# Patient Record
Sex: Male | Born: 1955 | Race: White | Hispanic: No | Marital: Married | Smoking: Never smoker
Health system: Southern US, Community
[De-identification: ages and names within clinical notes are randomized; demographics above are authoritative.]

## PROBLEM LIST (undated history)

## (undated) DIAGNOSIS — E785 Hyperlipidemia, unspecified: Secondary | ICD-10-CM

## (undated) DIAGNOSIS — E119 Type 2 diabetes mellitus without complications: Secondary | ICD-10-CM

## (undated) DIAGNOSIS — I2510 Atherosclerotic heart disease of native coronary artery without angina pectoris: Secondary | ICD-10-CM

## (undated) DIAGNOSIS — I10 Essential (primary) hypertension: Secondary | ICD-10-CM

## (undated) HISTORY — DX: Hyperlipidemia, unspecified: E78.5

## (undated) HISTORY — DX: Essential (primary) hypertension: I10

## (undated) HISTORY — DX: Type 2 diabetes mellitus without complications: E11.9

## (undated) HISTORY — DX: Atherosclerotic heart disease of native coronary artery without angina pectoris: I25.10

## (undated) HISTORY — PX: CORONARY ARTERY BYPASS GRAFT: SHX141

---

## 2015-01-24 DIAGNOSIS — I779 Disorder of arteries and arterioles, unspecified: Secondary | ICD-10-CM | POA: Insufficient documentation

## 2015-01-24 DIAGNOSIS — E782 Mixed hyperlipidemia: Secondary | ICD-10-CM | POA: Insufficient documentation

## 2015-01-24 DIAGNOSIS — I10 Essential (primary) hypertension: Secondary | ICD-10-CM | POA: Insufficient documentation

## 2015-02-08 DIAGNOSIS — E119 Type 2 diabetes mellitus without complications: Secondary | ICD-10-CM | POA: Insufficient documentation

## 2015-02-10 HISTORY — PX: AORTIC VALVE REPLACEMENT: SHX41

## 2015-03-01 DIAGNOSIS — Z951 Presence of aortocoronary bypass graft: Secondary | ICD-10-CM | POA: Insufficient documentation

## 2015-03-01 DIAGNOSIS — Z952 Presence of prosthetic heart valve: Secondary | ICD-10-CM | POA: Insufficient documentation

## 2015-03-01 DIAGNOSIS — I480 Paroxysmal atrial fibrillation: Secondary | ICD-10-CM | POA: Insufficient documentation

## 2015-03-09 ENCOUNTER — Encounter: Payer: BLUE CROSS/BLUE SHIELD | Attending: Thoracic Surgery (Cardiothoracic Vascular Surgery)

## 2015-03-09 DIAGNOSIS — Z951 Presence of aortocoronary bypass graft: Secondary | ICD-10-CM | POA: Insufficient documentation

## 2015-03-09 DIAGNOSIS — Z952 Presence of prosthetic heart valve: Secondary | ICD-10-CM | POA: Insufficient documentation

## 2015-03-16 ENCOUNTER — Encounter: Payer: Self-pay | Admitting: *Deleted

## 2015-03-16 ENCOUNTER — Encounter: Payer: BLUE CROSS/BLUE SHIELD | Admitting: *Deleted

## 2015-03-16 VITALS — Ht 68.5 in | Wt 217.9 lb

## 2015-03-16 DIAGNOSIS — Z952 Presence of prosthetic heart valve: Secondary | ICD-10-CM

## 2015-03-16 DIAGNOSIS — Z951 Presence of aortocoronary bypass graft: Secondary | ICD-10-CM

## 2015-03-16 NOTE — Patient Instructions (Signed)
Patient Instructions  Patient Details  Name: Franklin Calderon MRN: [id number] Date of Birth: 07/30/1956 Referring Provider:  Lolita Lenz, MD  Below are the personal goals you chose as well as exercise and nutrition goals. Our goal is to help you keep on track towards obtaining and maintaining your goals. We will be discussing your progress on these goals with you throughout the program.  Initial Exercise Prescription:     Initial Exercise Prescription - 03/16/15 1500    Date of Initial Exercise Prescription   Date 03/16/15   Treadmill   MPH 2.5   Grade 0   Minutes 10   Recumbant Bike   Level 2   RPM 45   Watts 25   Minutes 10   NuStep   Level 2   Watts 50   Minutes 10   Arm Ergometer   Level 1   Watts 10   Minutes 10   Elliptical   Level 1   Speed 3   Minutes 1   REL-XR   Level 2   Watts 50   Minutes 10   Prescription Details   Frequency (times per week) 3   Duration Progress to 30 minutes of continuous aerobic without signs/symptoms of physical distress   Intensity   THRR REST +  20   Ratings of Perceived Exertion 11-15   Perceived Dyspnea 2-4   Progression Continue progressive overload as per policy without signs/symptoms or physical distress.   Resistance Training   Training Prescription Yes   Weight 2   Reps 10-12      Exercise Goals: Frequency: Be able to perform aerobic exercise three times per week working toward 3-5 days per week.  Intensity: Work with a perceived exertion of 11 (fairly light) - 15 (hard) as tolerated. Follow your new exercise prescription and watch for changes in prescription as you progress with the program. Changes will be reviewed with you when they are made.  Duration: You should be able to do 30 minutes of continuous aerobic exercise in addition to a 5 minute warm-up and a 5 minute cool-down routine.  Nutrition Goals: Your personal nutrition goals will be established when you do your nutrition analysis with the  dietician.  The following are nutrition guidelines to follow: Cholesterol < /day Sodium < /day Fiber: Men over 50 yrs - 30 grams per day  Personal Goals:     Personal Goals and Risk Factors at Admission - 03/16/15 1525    Personal Goals and Risk Factors on Admission   Increase Aerobic Exercise and Physical Activity Yes   Intervention While in program, learn and follow the exercise prescription taught. Start at a low level workload and increase workload after able to maintain previous level for 30 minutes. Increase time before increasing intensity.   Diabetes Yes   Goal Blood glucose control identified by blood glucose values, HgbA1C. Participant verbalizes understanding of the signs/symptoms of hyper/hypo glycemia, proper foot care and importance of medication and nutrition plan for blood glucose control.   Intervention Provide nutrition & aerobic exercise along with prescribed medications to achieve blood glucose in normal ranges: Fasting 65-99 mg/dL   Hypertension Yes   Goal Participant will see blood pressure controlled within the values of 140/19mm/Hg or within value directed by their physician.   Intervention Provide nutrition & aerobic exercise along with prescribed medications to achieve BP 140/90 or less.   Lipids Yes   Goal Cholesterol controlled with medications as prescribed, with individualized exercise RX and with  personalized nutrition plan. Value goals: LDL < 70mg , HDL > 40mg . Participant states understanding of desired cholesterol values and following prescriptions.   Intervention Provide nutrition & aerobic exercise along with prescribed medications to achieve LDL 70mg , HDL >40mg .      Tobacco Use Initial Evaluation: History  Smoking status  . Never Smoker   Smokeless tobacco  . Not on file    Copy of goals given to participant.

## 2015-03-16 NOTE — Progress Notes (Signed)
Cardiac Individual Treatment Plan  Patient Details  Name: Franklin Calderon MRN: [id number] Date of Birth: 08/11/1956 Referring Provider:  Lolita LenzKincaid, Edward Hal, MD  Initial Encounter Date: Date: 03/16/15  Visit Diagnosis: No diagnosis found.  Patient's Home Medications on Admission:  Current outpatient prescriptions:  .  amiodarone (PACERONE) 200 MG tablet, Take 400 mg by mouth., Disp: , Rfl:  .  metoprolol succinate (TOPROL-XL) 25 MG 24 hr tablet, Take 25 mg by mouth., Disp: , Rfl:  .  aspirin 325 MG tablet, Take 81 mg by mouth., Disp: , Rfl:  .  atorvastatin (LIPITOR) 20 MG tablet, Take 20 mg by mouth., Disp: , Rfl:  .  Co-Enzyme Q-10 30 MG CAPS, Take 100 mg by mouth., Disp: , Rfl:  .  Insulin Detemir (LEVEMIR FLEXPEN [place]), Inject into the skin., Disp: , Rfl:  .  losartan-hydrochlorothiazide (HYZAAR) 50-12.5 MG per tablet, Take by mouth., Disp: , Rfl:  .  metFORMIN (GLUCOPHAGE) 1000 MG tablet, Take 1,000 mg by mouth., Disp: , Rfl:  .  Multiple Vitamin (MULTI-VITAMINS) TABS, Take by mouth., Disp: , Rfl:  .  omega-3 acid ethyl esters (LOVAZA) 1 G capsule, Take by mouth., Disp: , Rfl:  .  pioglitazone (ACTOS) 30 MG tablet, Take 30 mg by mouth., Disp: , Rfl:  .  Vitamin D, Ergocalciferol, (DRISDOL) 50000 UNITS CAPS capsule, Take by mouth., Disp: , Rfl:   Past Medical History: Past Medical History  Diagnosis Date  . Coronary artery disease   . Hypertension   . Hyperlipidemia   . Diabetes mellitus without complication     Tobacco Use: History  Smoking status  . Never Smoker   Smokeless tobacco  . Not on file    Labs: Recent Review Flowsheet Data    There is no flowsheet data to display.       Exercise Target Goals: Date: 03/16/15  Exercise Program Goal: Individual exercise prescription set with THRR, safety & activity barriers. Participant demonstrates ability to understand and report RPE using BORG scale, to self-measure pulse accurately, and to acknowledge the  importance of the exercise prescription.  Exercise Prescription Goal: Starting with aerobic activity 30 plus minutes a day, 3 days per week for initial exercise prescription. Provide home exercise prescription and guidelines that participant acknowledges understanding prior to discharge.  Activity Barriers & Risk Stratification:     Activity Barriers & Risk Stratification - 03/16/15 1510    Activity Barriers & Risk Stratification   Activity Barriers None   Risk Stratification High      6 Minute Walk:     6 Minute Walk      03/16/15 1513       6 Minute Walk   Phase Initial     Distance 1515 feet     Walk Time 6 minutes     Resting HR 85 bpm     Resting BP 124/80 mmHg     Max Ex. HR 102 bpm     Max Ex. BP 124/64 mmHg     RPE 11     Perceived Dyspnea  1     Symptoms No        Initial Exercise Prescription:     Initial Exercise Prescription - 03/16/15 1500    Date of Initial Exercise Prescription   Date 03/16/15   Treadmill   MPH 2.5   Grade 0   Minutes 10   Recumbant Bike   Level 2   RPM 45   Watts 25   Minutes 10  NuStep   Level 2   Watts 50   Minutes 10   Arm Ergometer   Level 1   Watts 10   Minutes 10   Elliptical   Level 1   Speed 3   Minutes 1   REL-XR   Level 2   Watts 50   Minutes 10   Prescription Details   Frequency (times per week) 3   Duration Progress to 30 minutes of continuous aerobic without signs/symptoms of physical distress   Intensity   THRR REST +  20   Ratings of Perceived Exertion 11-15   Perceived Dyspnea 2-4   Progression Continue progressive overload as per policy without signs/symptoms or physical distress.   Resistance Training   Training Prescription Yes   Weight 2   Reps 10-12      Exercise Prescription Changes:   Discharge Exercise Prescription (Final Exercise Prescription Changes):   Nutrition:  Target Goals: Understanding of nutrition guidelines, daily intake of sodium 1500mg , cholesterol 200mg ,  calories 30% from fat and 7% or less from saturated fats, daily to have 5 or more servings of fruits and vegetables.  Biometrics:     Pre Biometrics - 03/16/15 1515    Pre Biometrics   Height 5' 8.5" (1.74 m)   Weight 217 lb 14.4 oz (98.839 kg)   Waist Circumference 41.5 inches   Hip Circumference 43.5 inches   Waist to Hip Ratio 0.95 %   BMI (Calculated) 32.7       Nutrition Therapy Plan and Nutrition Goals:     Nutrition Therapy & Goals - 03/16/15 1525    Intervention Plan   Intervention Using nutrition plan and personal goals to gain a healthy nutrition lifestyle. Add exercise as prescribed.      Nutrition Discharge: Rate Your Plate Scores:   Nutrition Goals Re-Evaluation:   Psychosocial: Target Goals: Acknowledge presence or absence of depression, maximize coping skills, provide positive support system. Participant is able to verbalize types and ability to use techniques and skills needed for reducing stress and depression.  Initial Review & Psychosocial Screening:     Initial Psych Review & Screening - 03/16/15 1520    Family Dynamics   Good Support System? Yes   Barriers   Psychosocial barriers to participate in program There are no identifiable barriers or psychosocial needs.   Screening Interventions   Interventions Encouraged to exercise      Quality of Life Scores:     Quality of Life - 03/16/15 1520    Quality of Life Scores   Health/Function Pre 26.4 %   Socioeconomic Pre 28.63 %   Psych/Spiritual Pre 23.57 %   Family Pre 28.8 %   GLOBAL Pre 26.69 %      PHQ-9:     Recent Review Flowsheet Data    Depression screen [facility] 2/9 03/16/2015   Decreased Interest 1   Down, Depressed, Hopeless 0   PHQ - 2 Score 1   Altered sleeping 1   Tired, decreased energy 1   Change in appetite 1   Feeling bad or failure about yourself  0   Trouble concentrating 0   Moving slowly or fidgety/restless 0   Suicidal thoughts 0   PHQ-9 Score 4   Difficult  doing work/chores Somewhat difficult      Psychosocial Evaluation and Intervention:   Psychosocial Re-Evaluation:   Vocational Rehabilitation: Provide vocational rehab assistance to qualifying candidates.   Vocational Rehab Evaluation & Intervention:     Vocational Rehab -  03/16/15 1523    Initial Vocational Rehab Evaluation & Intervention   Assessment shows need for Vocational Rehabilitation No      Education: Education Goals: Education classes will be provided on a weekly basis, covering required topics. Participant will state understanding/return demonstration of topics presented.  Learning Barriers/Preferences:     Learning Barriers/Preferences - 03/16/15 1512    Learning Barriers/Preferences   Learning Barriers None   Learning Preferences Written Material      Education Topics: General Nutrition Guidelines/Fats and Fiber: -Group instruction provided by verbal, written material, models and posters to present the general guidelines for heart healthy nutrition. Gives an explanation and review of dietary fats and fiber.   Controlling Sodium/Reading Food Labels: -Group verbal and written material supporting the discussion of sodium use in heart healthy nutrition. Review and explanation with models, verbal and written materials for utilization of the food label.   Exercise Physiology & Risk Factors: - Group verbal and written instruction with models to review the exercise physiology of the cardiovascular system and associated critical values. Details cardiovascular disease risk factors and the goals associated with each risk factor.   Aerobic Exercise & Resistance Training: - Gives group verbal and written discussion on the health impact of inactivity. On the components of aerobic and resistive training programs and the benefits of this training and how to safely progress through these programs.   Flexibility, Balance, General Exercise Guidelines: - Provides group  verbal and written instruction on the benefits of flexibility and balance training programs. Provides general exercise guidelines with specific guidelines to those with heart or lung disease. Demonstration and skill practice provided.   Stress Management: - Provides group verbal and written instruction about the health risks of elevated stress, cause of high stress, and healthy ways to reduce stress.   Depression: - Provides group verbal and written instruction on the correlation between heart/lung disease and depressed mood, treatment options, and the stigmas associated with seeking treatment.   Anatomy & Physiology of the Heart: - Group verbal and written instruction and models provide basic cardiac anatomy and physiology, with the coronary electrical and arterial systems. Review of: AMI, Angina, Valve disease, Heart Failure, Cardiac Arrhythmia, Pacemakers, and the ICD.   Cardiac Procedures: - Group verbal and written instruction and models to describe the testing methods done to diagnose heart disease. Reviews the outcomes of the test results. Describes the treatment choices: Medical Management, Angioplasty, or Coronary Bypass Surgery.   Cardiac Medications: - Group verbal and written instruction to review commonly prescribed medications for heart disease. Reviews the medication, class of the drug, and side effects. Includes the steps to properly store meds and maintain the prescription regimen.   Go Sex-Intimacy & Heart Disease, Get SMART - Goal Setting: - Group verbal and written instruction through game format to discuss heart disease and the return to sexual intimacy. Provides group verbal and written material to discuss and apply goal setting through the application of the S.M.A.R.T. Method.   Other Matters of the Heart: - Provides group verbal, written materials and models to describe Heart Failure, Angina, Valve Disease, and Diabetes in the realm of heart disease. Includes  description of the disease process and treatment options available to the cardiac patient.   Exercise & Equipment Safety: - Individual verbal instruction and demonstration of equipment use and safety with use of the equipment.          Most Recent Value   Date  03/16/15   Educator  C. Pearla Mckinny,RN  Instruction Review Code  1- partially meets, needs review/practice      Infection Prevention: - Provides verbal and written material to individual with discussion of infection control including proper hand washing and proper equipment cleaning during exercise session.      Most Recent Value   Date  03/16/15   Educator  C. Christiano Blandon,RN   Instruction Review Code  2- meets goals/outcomes      Falls Prevention: - Provides verbal and written material to individual with discussion of falls prevention and safety.      Most Recent Value   Date  03/16/15   Educator  C. Kitiara Hintze,RN   Instruction Review Code  2- meets goals/outcomes      Diabetes: - Individual verbal and written instruction to review signs/symptoms of diabetes, desired ranges of glucose level fasting, after meals and with exercise. Advice that pre and post exercise glucose checks will be done for 3 sessions at entry of program.      Most Recent Value   Date  03/16/15   Educator  C. Brody Kump,RN   Instruction Review Code  1- partially meets, needs review/practice       Knowledge Questionnaire Score:     Knowledge Questionnaire Score - 03/16/15 1521    Knowledge Questionnaire Score   Pre Score 26      Personal Goals and Risk Factors at Admission:     Personal Goals and Risk Factors at Admission - 03/16/15 1525    Personal Goals and Risk Factors on Admission   Increase Aerobic Exercise and Physical Activity Yes   Intervention While in program, learn and follow the exercise prescription taught. Start at a low level workload and increase workload after able to maintain previous level for 30 minutes. Increase time  before increasing intensity.   Diabetes Yes   Goal Blood glucose control identified by blood glucose values, HgbA1C. Participant verbalizes understanding of the signs/symptoms of hyper/hypo glycemia, proper foot care and importance of medication and nutrition plan for blood glucose control.   Intervention Provide nutrition & aerobic exercise along with prescribed medications to achieve blood glucose in normal ranges: Fasting 65-99 mg/dL   Hypertension Yes   Goal Participant will see blood pressure controlled within the values of 140/3mm/Hg or within value directed by their physician.   Intervention Provide nutrition & aerobic exercise along with prescribed medications to achieve BP 140/90 or less.   Lipids Yes   Goal Cholesterol controlled with medications as prescribed, with individualized exercise RX and with personalized nutrition plan. Value goals: LDL < , HDL > . Participant states understanding of desired cholesterol values and following prescriptions.   Intervention Provide nutrition & aerobic exercise along with prescribed medications to achieve LDL 70mg , HDL >40mg .      Personal Goals and Risk Factors Review:    Personal Goals Discharge:     Comments: Medical Review done. Ready to start Cardiac Rehab on Monday.

## 2015-03-20 DIAGNOSIS — Z951 Presence of aortocoronary bypass graft: Secondary | ICD-10-CM

## 2015-03-20 DIAGNOSIS — Z952 Presence of prosthetic heart valve: Secondary | ICD-10-CM

## 2015-03-20 LAB — GLUCOSE, CAPILLARY: Glucose-Capillary: 172 mg/dL — ABNORMAL HIGH (ref 65–99)

## 2015-03-20 NOTE — Progress Notes (Signed)
Daily Session Note  Patient Details  Name: Franklin Calderon MRN: [id number] Date of Birth: 03-Dec-1955 Referring Provider:  Almyra Deforest, MD  Encounter Date: 03/20/2015  Check In:     Session Check In - 03/20/15 1628    Check-In   Staff Present Lestine Box BS, ACSM EP-C, Exercise Physiologist;Mary Kellie Shropshire RN;Susanne Bice RN, BSN, [place]   ER physicians immediately available to respond to emergencies See telemetry face sheet for immediately available ER MD   Medication changes reported     No   Fall or balance concerns reported    No   Warm-up and Cool-down Performed on first and last piece of equipment   VAD Patient? No   Pain Assessment   Currently in Pain? No/denies         Goals Met:  Exercise tolerated well No report of cardiac concerns or symptoms  Goals Unmet:  Not Applicable  Goals Comments: Today was [name]'s first day with exercise and he did well with his prescription.     Dr. Emily Filbert is Medical Director for [facility].

## 2015-03-21 ENCOUNTER — Encounter: Payer: Self-pay | Admitting: *Deleted

## 2015-03-21 DIAGNOSIS — Z952 Presence of prosthetic heart valve: Secondary | ICD-10-CM

## 2015-03-21 DIAGNOSIS — Z951 Presence of aortocoronary bypass graft: Secondary | ICD-10-CM

## 2015-03-21 NOTE — Progress Notes (Signed)
Cardiac Individual Treatment Plan  Patient Details  Name: Delmos Velaquez MRN: [id number] Date of Birth: 04-16-1956 Referring Provider:  Lolita Lenz, MD  Initial Encounter Date:  03/16/2015  Visit Diagnosis: S/P CABG (coronary artery bypass graft)  S/P aortic valve replacement  Patient's Home Medications on Admission:  Current outpatient prescriptions:  .  amiodarone (PACERONE) 200 MG tablet, Take 400 mg by mouth., Disp: , Rfl:  .  aspirin 325 MG tablet, Take 81 mg by mouth., Disp: , Rfl:  .  atorvastatin (LIPITOR) 20 MG tablet, Take 20 mg by mouth., Disp: , Rfl:  .  Co-Enzyme Q-10 30 MG CAPS, Take 100 mg by mouth., Disp: , Rfl:  .  Insulin Detemir (LEVEMIR FLEXPEN Baker), Inject into the skin., Disp: , Rfl:  .  losartan-hydrochlorothiazide (HYZAAR) 50-12.5 MG per tablet, Take by mouth., Disp: , Rfl:  .  metFORMIN (GLUCOPHAGE) 1000 MG tablet, Take 1,000 mg by mouth., Disp: , Rfl:  .  metoprolol succinate (TOPROL-XL) 25 MG 24 hr tablet, Take 25 mg by mouth., Disp: , Rfl:  .  Multiple Vitamin (MULTI-VITAMINS) TABS, Take by mouth., Disp: , Rfl:  .  omega-3 acid ethyl esters (LOVAZA) 1 G capsule, Take by mouth., Disp: , Rfl:  .  pioglitazone (ACTOS) 30 MG tablet, Take 30 mg by mouth., Disp: , Rfl:  .  Vitamin D, Ergocalciferol, (DRISDOL) 50000 UNITS CAPS capsule, Take by mouth., Disp: , Rfl:   Past Medical History: Past Medical History  Diagnosis Date  . Coronary artery disease   . Hypertension   . Hyperlipidemia   . Diabetes mellitus without complication     Tobacco Use: History  Smoking status  . Never Smoker   Smokeless tobacco  . Not on file    Labs: Recent Review Flowsheet Data    There is no flowsheet data to display.       Exercise Target Goals:    Exercise Program Goal: Individual exercise prescription set with THRR, safety & activity barriers. Participant demonstrates ability to understand and report RPE using BORG scale, to self-measure pulse  accurately, and to acknowledge the importance of the exercise prescription.  Exercise Prescription Goal: Starting with aerobic activity 30 plus minutes a day, 3 days per week for initial exercise prescription. Provide home exercise prescription and guidelines that participant acknowledges understanding prior to discharge.  Activity Barriers & Risk Stratification:     Activity Barriers & Risk Stratification - 03/16/15 1510    Activity Barriers & Risk Stratification   Activity Barriers None   Risk Stratification High      6 Minute Walk:     6 Minute Walk      03/16/15 1513       6 Minute Walk   Phase Initial     Distance 1515 feet     Walk Time 6 minutes     Resting HR 85 bpm     Resting BP 124/80 mmHg     Max Ex. HR 102 bpm     Max Ex. BP 124/64 mmHg     RPE 11     Perceived Dyspnea  1     Symptoms No        Initial Exercise Prescription:     Initial Exercise Prescription - 03/16/15 1500    Date of Initial Exercise Prescription   Date 03/16/15   Treadmill   MPH 2.5   Grade 0   Minutes 10   Recumbant Bike   Level 2   RPM 45  Watts 25   Minutes 10   NuStep   Level 2   Watts 50   Minutes 10   Arm Ergometer   Level 1   Watts 10   Minutes 10   Elliptical   Level 1   Speed 3   Minutes 1   REL-XR   Level 2   Watts 50   Minutes 10   Prescription Details   Frequency (times per week) 3   Duration Progress to 30 minutes of continuous aerobic without signs/symptoms of physical distress   Intensity   THRR REST +  20   Ratings of Perceived Exertion 11-15   Perceived Dyspnea 2-4   Progression Continue progressive overload as per policy without signs/symptoms or physical distress.   Resistance Training   Training Prescription Yes   Weight 2   Reps 10-12      Exercise Prescription Changes:   Discharge Exercise Prescription (Final Exercise Prescription Changes):   Nutrition:  Target Goals: Understanding of nutrition guidelines, daily intake of  sodium 1500mg , cholesterol 200mg , calories 30% from fat and 7% or less from saturated fats, daily to have 5 or more servings of fruits and vegetables.  Biometrics:     Pre Biometrics - 03/16/15 1515    Pre Biometrics   Height 5' 8.5" (1.74 m)   Weight 217 lb 14.4 oz (98.839 kg)   Waist Circumference 41.5 inches   Hip Circumference 43.5 inches   Waist to Hip Ratio 0.95 %   BMI (Calculated) 32.7       Nutrition Therapy Plan and Nutrition Goals:     Nutrition Therapy & Goals - 03/16/15 1525    Intervention Plan   Intervention Using nutrition plan and personal goals to gain a healthy nutrition lifestyle. Add exercise as prescribed.      Nutrition Discharge: Rate Your Plate Scores:   Nutrition Goals Re-Evaluation:   Psychosocial: Target Goals: Acknowledge presence or absence of depression, maximize coping skills, provide positive support system. Participant is able to verbalize types and ability to use techniques and skills needed for reducing stress and depression.  Initial Review & Psychosocial Screening:     Initial Psych Review & Screening - 03/16/15 1520    Family Dynamics   Good Support System? Yes   Barriers   Psychosocial barriers to participate in program There are no identifiable barriers or psychosocial needs.   Screening Interventions   Interventions Encouraged to exercise      Quality of Life Scores:     Quality of Life - 03/16/15 1520    Quality of Life Scores   Health/Function Pre 26.4 %   Socioeconomic Pre 28.63 %   Psych/Spiritual Pre 23.57 %   Family Pre 28.8 %   GLOBAL Pre 26.69 %      PHQ-9:     Recent Review Flowsheet Data    Depression screen [facility] 2/9 03/16/2015   Decreased Interest 1   Down, Depressed, Hopeless 0   PHQ - 2 Score 1   Altered sleeping 1   Tired, decreased energy 1   Change in appetite 1   Feeling bad or failure about yourself  0   Trouble concentrating 0   Moving slowly or fidgety/restless 0   Suicidal  thoughts 0   PHQ-9 Score 4   Difficult doing work/chores Somewhat difficult      Psychosocial Evaluation and Intervention:   Psychosocial Re-Evaluation:   Vocational Rehabilitation: Provide vocational rehab assistance to qualifying candidates.   Vocational Rehab Evaluation &  Intervention:     Vocational Rehab - 03/16/15 1523    Initial Vocational Rehab Evaluation & Intervention   Assessment shows need for Vocational Rehabilitation No      Education: Education Goals: Education classes will be provided on a weekly basis, covering required topics. Participant will state understanding/return demonstration of topics presented.  Learning Barriers/Preferences:     Learning Barriers/Preferences - 03/16/15 1512    Learning Barriers/Preferences   Learning Barriers None   Learning Preferences Written Material      Education Topics: General Nutrition Guidelines/Fats and Fiber: -Group instruction provided by verbal, written material, models and posters to present the general guidelines for heart healthy nutrition. Gives an explanation and review of dietary fats and fiber.   Controlling Sodium/Reading Food Labels: -Group verbal and written material supporting the discussion of sodium use in heart healthy nutrition. Review and explanation with models, verbal and written materials for utilization of the food label.   Exercise Physiology & Risk Factors: - Group verbal and written instruction with models to review the exercise physiology of the cardiovascular system and associated critical values. Details cardiovascular disease risk factors and the goals associated with each risk factor.   Aerobic Exercise & Resistance Training: - Gives group verbal and written discussion on the health impact of inactivity. On the components of aerobic and resistive training programs and the benefits of this training and how to safely progress through these programs.          Cardiac Rehab from  03/20/2015 in [facility] Cardiac Rehab   Date  03/20/15   Educator  SW   Instruction Review Code  2- meets goals/outcomes      Flexibility, Balance, General Exercise Guidelines: - Provides group verbal and written instruction on the benefits of flexibility and balance training programs. Provides general exercise guidelines with specific guidelines to those with heart or lung disease. Demonstration and skill practice provided.   Stress Management: - Provides group verbal and written instruction about the health risks of elevated stress, cause of high stress, and healthy ways to reduce stress.   Depression: - Provides group verbal and written instruction on the correlation between heart/lung disease and depressed mood, treatment options, and the stigmas associated with seeking treatment.   Anatomy & Physiology of the Heart: - Group verbal and written instruction and models provide basic cardiac anatomy and physiology, with the coronary electrical and arterial systems. Review of: AMI, Angina, Valve disease, Heart Failure, Cardiac Arrhythmia, Pacemakers, and the ICD.   Cardiac Procedures: - Group verbal and written instruction and models to describe the testing methods done to diagnose heart disease. Reviews the outcomes of the test results. Describes the treatment choices: Medical Management, Angioplasty, or Coronary Bypass Surgery.   Cardiac Medications: - Group verbal and written instruction to review commonly prescribed medications for heart disease. Reviews the medication, class of the drug, and side effects. Includes the steps to properly store meds and maintain the prescription regimen.   Go Sex-Intimacy & Heart Disease, Get SMART - Goal Setting: - Group verbal and written instruction through game format to discuss heart disease and the return to sexual intimacy. Provides group verbal and written material to discuss and apply goal setting through the application of the S.M.A.R.T.  Method.   Other Matters of the Heart: - Provides group verbal, written materials and models to describe Heart Failure, Angina, Valve Disease, and Diabetes in the realm of heart disease. Includes description of the disease process and treatment options available to the cardiac  patient.   Exercise & Equipment Safety: - Individual verbal instruction and demonstration of equipment use and safety with use of the equipment.      [facility]   Date  03/16/15   Educator  C. Enterkin,RN   Instruction Review Code  1- partially meets, needs review/practice      Infection Prevention: - Provides verbal and written material to individual with discussion of infection control including proper hand washing and proper equipment cleaning during exercise session.      [facility]   Date  03/16/15   Educator  C. Enterkin,RN   Instruction Review Code  2- meets goals/outcomes      Falls Prevention: - Provides verbal and written material to individual with discussion of falls prevention and safety.      [facility]   Date  03/16/15   Educator  C. Enterkin,RN   Instruction Review Code  2- meets goals/outcomes      Diabetes: - Individual verbal and written instruction to review signs/symptoms of diabetes, desired ranges of glucose level fasting, after meals and with exercise. Advice that pre and post exercise glucose checks will be done for 3 sessions at entry of program.      [facility]   Date  03/16/15   Educator  C. Enterkin,RN   Instruction Review Code  1- partially meets, needs review/practice       Knowledge Questionnaire Score:     Knowledge Questionnaire Score - 03/16/15 1521    Knowledge Questionnaire Score   Pre Score 26      Personal Goals and Risk Factors at Admission:     Personal Goals and Risk Factors at Admission -  03/16/15 1525    Personal Goals and Risk Factors on Admission   Increase Aerobic Exercise and Physical Activity Yes   Intervention While in program, learn and follow the exercise prescription taught. Start at a low level workload and increase workload after able to maintain previous level for 30 minutes. Increase time before increasing intensity.   Diabetes Yes   Goal Blood glucose control identified by blood glucose values, HgbA1C. Participant verbalizes understanding of the signs/symptoms of hyper/hypo glycemia, proper foot care and importance of medication and nutrition plan for blood glucose control.   Intervention Provide nutrition & aerobic exercise along with prescribed medications to achieve blood glucose in normal ranges: Fasting 65-99 mg/dL   Hypertension Yes   Goal Participant will see blood pressure controlled within the values of 140/3790mm/Hg or within value directed by their physician.   Intervention Provide nutrition & aerobic exercise along with prescribed medications to achieve BP 140/90 or less.   Lipids Yes   Goal Cholesterol controlled with medications as prescribed, with individualized exercise RX and with personalized nutrition plan. Value goals: LDL < 70mg , HDL > 40mg . Participant states understanding of desired cholesterol values and following prescriptions.   Intervention Provide nutrition & aerobic exercise along with prescribed medications to achieve LDL 70mg , HDL >40mg .      Personal Goals and Risk Factors Review:    Personal Goals Discharge:     Comments: 30 day review.  Molly MaduroRobert has completed orientation and will start classes soon.

## 2015-03-22 ENCOUNTER — Other Ambulatory Visit: Payer: Self-pay | Admitting: *Deleted

## 2015-03-22 ENCOUNTER — Encounter: Payer: BLUE CROSS/BLUE SHIELD | Admitting: *Deleted

## 2015-03-22 DIAGNOSIS — Z952 Presence of prosthetic heart valve: Secondary | ICD-10-CM

## 2015-03-22 DIAGNOSIS — Z951 Presence of aortocoronary bypass graft: Secondary | ICD-10-CM | POA: Diagnosis not present

## 2015-03-22 LAB — GLUCOSE, CAPILLARY
Glucose-Capillary: 148 mg/dL — ABNORMAL HIGH (ref 65–99)
Glucose-Capillary: 115 mg/dL — ABNORMAL HIGH (ref 65–99)

## 2015-03-22 NOTE — Progress Notes (Signed)
Daily Session Note  Patient Details  Name: Helmuth Recupero MRN: [id number] Date of Birth: February 20, 1956 Referring Provider:  Almyra Deforest, MD  Encounter Date: 03/22/2015  Check In:     Session Check In - 03/22/15 1606    Check-In   Staff Present Nyoka Cowden RN;Renee [place] MS, ACSM CEP Exercise Physiologist;Shahil Speegle Mariana Arn, BSN   ER physicians immediately available to respond to emergencies See telemetry face sheet for immediately available ER MD   Medication changes reported     No   Fall or balance concerns reported    No   Warm-up and Cool-down Performed on first and last piece of equipment   VAD Patient? No   Pain Assessment   Currently in Pain? No/denies         Goals Met:  Exercise tolerated well No report of cardiac concerns or symptoms Strength training completed today  Goals Unmet:  Not Applicable  Goals Comments:    Dr. Emily Filbert is Medical Director for [place] and [facility].

## 2015-03-23 DIAGNOSIS — Z951 Presence of aortocoronary bypass graft: Secondary | ICD-10-CM | POA: Diagnosis not present

## 2015-03-23 DIAGNOSIS — Z952 Presence of prosthetic heart valve: Secondary | ICD-10-CM

## 2015-03-23 LAB — GLUCOSE, CAPILLARY
GLUCOSE-CAPILLARY: 164 mg/dL — AB (ref 65–99)
GLUCOSE-CAPILLARY: 100 mg/dL — AB (ref 65–99)

## 2015-03-23 NOTE — Progress Notes (Signed)
Daily Session Note  Patient Details  Name: Franklin Calderon MRN: [id number] Date of Birth: 09-09-1955 Referring Provider:  Almyra Deforest, MD  Encounter Date: 03/23/2015  Check In:     Session Check In - 03/23/15 1605    Check-In   Staff Present Gerlene Burdock RN, BSN;Kameshia Madruga BS, ACSM EP-C, Exercise Physiologist;Diane Mariana Arn, BSN   ER physicians immediately available to respond to emergencies See telemetry face sheet for immediately available ER MD   Medication changes reported     No   Fall or balance concerns reported    No   Warm-up and Cool-down Performed on first and last piece of equipment   VAD Patient? No   Pain Assessment   Currently in Pain? No/denies         Goals Met:  Proper associated with RPD/PD & O2 Sat Exercise tolerated well No report of cardiac concerns or symptoms Strength training completed today  Goals Unmet:  Not Applicable  Goals Comments:   Dr. Emily Filbert is Medical Director for [place] and [facility].

## 2015-03-27 ENCOUNTER — Encounter: Payer: BLUE CROSS/BLUE SHIELD | Admitting: *Deleted

## 2015-03-27 DIAGNOSIS — Z951 Presence of aortocoronary bypass graft: Secondary | ICD-10-CM | POA: Diagnosis not present

## 2015-03-27 DIAGNOSIS — Z952 Presence of prosthetic heart valve: Secondary | ICD-10-CM

## 2015-03-27 LAB — GLUCOSE, CAPILLARY: Glucose-Capillary: 115 mg/dL — ABNORMAL HIGH (ref 65–99)

## 2015-03-27 NOTE — Progress Notes (Signed)
Daily Session Note  Patient Details  Name: Franklin Calderon MRN: [id number] Date of Birth: [birth year] Referring Provider:  Almyra Deforest, MD  Encounter Date: 03/27/2015  Check In:     Session Check In - 03/27/15 1610    Check-In   Staff Present Jackolyn Geron Joya Gaskins RN, BSN;Carroll Enterkin RN, BSN  Hessie Knows, Exercise SpecIiaist   ER physicians immediately available to respond to emergencies See telemetry face sheet for immediately available ER MD   Medication changes reported     No   Fall or balance concerns reported    No   Warm-up and Cool-down Performed on first and last piece of equipment   VAD Patient? No   Pain Assessment   Currently in Pain? No/denies         Goals Met:  Exercise tolerated well No report of cardiac concerns or symptoms Strength training completed today  Exercise goals reviewed with participant.    Goals Unmet:  Not Applicable  Goals Comments:    Dr. Emily Filbert is Medical Director for [place] and [facility].

## 2015-03-29 ENCOUNTER — Encounter: Payer: BLUE CROSS/BLUE SHIELD | Admitting: *Deleted

## 2015-03-29 DIAGNOSIS — Z951 Presence of aortocoronary bypass graft: Secondary | ICD-10-CM | POA: Diagnosis not present

## 2015-03-29 DIAGNOSIS — Z952 Presence of prosthetic heart valve: Secondary | ICD-10-CM

## 2015-03-29 NOTE — Progress Notes (Signed)
Daily Session Note  Patient Details  Name: Franklin Calderon MRN: [id number] Date of Birth: 21-Sep-1955 Referring Provider:  Almyra Deforest, MD  Encounter Date: 03/29/2015  Check In:     Session Check In - 03/29/15 1602    Check-In   Staff Present Candiss Norse MS, ACSM CEP Exercise Physiologist;Susanne Bice RN, BSN, CCRP;Carroll Enterkin RN, BSN   ER physicians immediately available to respond to emergencies See telemetry face sheet for immediately available ER MD   Medication changes reported     No   Fall or balance concerns reported    No   Warm-up and Cool-down Performed on first and last piece of equipment   VAD Patient? No   Pain Assessment   Currently in Pain? No/denies   Multiple Pain Sites No         Goals Met:  Independence with exercise equipment Exercise tolerated well Personal goals reviewed No report of cardiac concerns or symptoms Strength training completed today  Goals Unmet:  Not Applicable  Goals Comments:   Dr. Emily Filbert is Medical Director for [place] and [facility].

## 2015-03-30 DIAGNOSIS — Z952 Presence of prosthetic heart valve: Secondary | ICD-10-CM

## 2015-03-30 DIAGNOSIS — Z951 Presence of aortocoronary bypass graft: Secondary | ICD-10-CM | POA: Diagnosis not present

## 2015-03-30 NOTE — Progress Notes (Signed)
Daily Session Note  Patient Details  Name: Alisha Burgo MRN: [id number] Date of Birth: 1956-07-14 Referring Provider:  Almyra Deforest, MD  Encounter Date: 03/30/2015  Check In:     Session Check In - 03/30/15 1621    Check-In   Staff Present Lestine Box BS, ACSM EP-C, Exercise Physiologist;Carroll Enterkin RN, BSN;Diane Joya Gaskins RN, BSN   ER physicians immediately available to respond to emergencies See telemetry face sheet for immediately available ER MD   Medication changes reported     No   Fall or balance concerns reported    No   Warm-up and Cool-down Performed on first and last piece of equipment   VAD Patient? No   Pain Assessment   Currently in Pain? No/denies         Goals Met:  Proper associated with RPD/PD & O2 Sat Exercise tolerated well No report of cardiac concerns or symptoms Strength training completed today  Goals Unmet:  Not Applicable  Goals Comments:    Dr. Emily Filbert is Medical Director for [place] and [facility].

## 2015-04-03 ENCOUNTER — Encounter: Payer: BLUE CROSS/BLUE SHIELD | Attending: Thoracic Surgery (Cardiothoracic Vascular Surgery)

## 2015-04-03 DIAGNOSIS — Z952 Presence of prosthetic heart valve: Secondary | ICD-10-CM | POA: Insufficient documentation

## 2015-04-03 DIAGNOSIS — Z951 Presence of aortocoronary bypass graft: Secondary | ICD-10-CM | POA: Diagnosis not present

## 2015-04-03 NOTE — Progress Notes (Signed)
Daily Session Note  Patient Details  Name: Franklin Calderon MRN: [id number] Date of Birth: 04/06/1956 Referring Provider:  Almyra Deforest, MD  Encounter Date: 04/03/2015  Check In:     Session Check In - 04/03/15 1631    Check-In   Staff Present Heath Lark RN, BSN, CCRP;Carroll Enterkin RN, BSN;Other   ER physicians immediately available to respond to emergencies See telemetry face sheet for immediately available ER MD   Medication changes reported     No   Fall or balance concerns reported    No   Warm-up and Cool-down Performed on first and last piece of equipment   VAD Patient? No   Pain Assessment   Currently in Pain? No/denies         Goals Met:  Independence with exercise equipment Exercise tolerated well No report of cardiac concerns or symptoms Strength training completed today  Goals Unmet:  Not Applicable  Goals Comments:    Dr. Emily Filbert is Medical Director for [place] and [facility].

## 2015-04-05 ENCOUNTER — Encounter: Payer: BLUE CROSS/BLUE SHIELD | Admitting: *Deleted

## 2015-04-05 DIAGNOSIS — Z951 Presence of aortocoronary bypass graft: Secondary | ICD-10-CM

## 2015-04-05 NOTE — Progress Notes (Signed)
Daily Session Note  Patient Details  Name: Geovany Trudo MRN: [id number] Date of Birth: 02-Feb-1956 Referring Provider:  Almyra Deforest, MD  Encounter Date: 04/05/2015  Check In:     Session Check In - 04/05/15 1741    Check-In   Staff Present Gerlene Burdock RN, BSN;Diane Joya Gaskins RN, BSN;Renee Dillard [place] MS, ACSM CEP Exercise Physiologist   ER physicians immediately available to respond to emergencies See telemetry face sheet for immediately available ER MD   Medication changes reported     Yes   Fall or balance concerns reported    Yes   Warm-up and Cool-down Performed on first and last piece of equipment   VAD Patient? No   Pain Assessment   Currently in Pain? No/denies         Goals Met:  Proper associated with RPD/PD & O2 Sat Exercise tolerated well  Goals Unmet:  Not Applicable  Goals Comments: Doing well in Cardiac Rehab.    Dr. Emily Filbert is Medical Director for [place] and [facility].

## 2015-04-06 DIAGNOSIS — Z951 Presence of aortocoronary bypass graft: Secondary | ICD-10-CM | POA: Diagnosis not present

## 2015-04-06 NOTE — Progress Notes (Signed)
Discharge Summary  Patient Details  Name: Franklin Calderon MRN: [id number] Date of Birth: [birth year] Referring Provider:  Lolita Lenz, MD   Number of Visits: 14/36  Reason for Discharge:  Early Exit:  Back to work.  Patient leaving the program at this time, as he is returning to work on Monday, April 10, 2015.   Smoking History:  History  Smoking status  . Never Smoker   Smokeless tobacco  . Not on file    Diagnosis:  S/P CABG (coronary artery bypass graft)  ADL UCSD:   Initial Exercise Prescription:     Initial Exercise Prescription - 03/16/15 1500    Date of Initial Exercise Prescription   Date 03/16/15   Treadmill   MPH 2.5   Grade 0   Minutes 10   Recumbant Bike   Level 2   RPM 45   Watts 25   Minutes 10   NuStep   Level 2   Watts 50   Minutes 10   Arm Ergometer   Level 1   Watts 10   Minutes 10   Elliptical   Level 1   Speed 3   Minutes 1   REL-XR   Level 2   Watts 50   Minutes 10   Prescription Details   Frequency (times per week) 3   Duration Progress to 30 minutes of continuous aerobic without signs/symptoms of physical distress   Intensity   THRR REST +  20   Ratings of Perceived Exertion 11-15   Perceived Dyspnea 2-4   Progression Continue progressive overload as per policy without signs/symptoms or physical distress.   Resistance Training   Training Prescription Yes   Weight 2   Reps 10-12      Discharge Exercise Prescription (Final Exercise Prescription Changes):     Exercise Prescription Changes - 03/27/15 1600    Exercise Review   Progression Yes   Resistance Training   Training Prescription Yes   Weight 3 lbs   Reps 10-12   NuStep   Level 5   Watts 60   Minutes 20   Elliptical   Level 2   Speed 3.5   Minutes 20      Functional Capacity:     6 Minute Walk      03/16/15 1513       6 Minute Walk   Phase Initial     Distance 1515 feet     Walk Time 6 minutes     Resting HR 85 bpm     Resting BP  124/80 mmHg     Max Ex. HR 102 bpm     Max Ex. BP 124/64 mmHg     RPE 11     Perceived Dyspnea  1     Symptoms No        Psychological, QOL, Others - Outcomes: PHQ 2/9: Depression screen PHQ 2/9 03/16/2015  Decreased Interest 1  Down, Depressed, Hopeless 0  PHQ - 2 Score 1  Altered sleeping 1  Tired, decreased energy 1  Change in appetite 1  Feeling bad or failure about yourself  0  Trouble concentrating 0  Moving slowly or fidgety/restless 0  Suicidal thoughts 0  PHQ-9 Score 4  Difficult doing work/chores Somewhat difficult    Quality of Life:     Quality of Life - 03/16/15 1520    Quality of Life Scores   Health/Function Pre 26.4 %   Socioeconomic Pre 28.63 %   Psych/Spiritual Pre 23.57 %  Family Pre 28.8 %   GLOBAL Pre 26.69 %      Personal Goals: Goals established at orientation with interventions provided to work toward goal.     Personal Goals and Risk Factors at Admission - 03/16/15 1525    Personal Goals and Risk Factors on Admission   Increase Aerobic Exercise and Physical Activity Yes   Intervention While in program, learn and follow the exercise prescription taught. Start at a low level workload and increase workload after able to maintain previous level for 30 minutes. Increase time before increasing intensity.   Diabetes Yes   Goal Blood glucose control identified by blood glucose values, HgbA1C. Participant verbalizes understanding of the signs/symptoms of hyper/hypo glycemia, proper foot care and importance of medication and nutrition plan for blood glucose control.   Intervention Provide nutrition & aerobic exercise along with prescribed medications to achieve blood glucose in normal ranges: Fasting 65-99 mg/dL   Hypertension Yes   Goal Participant will see blood pressure controlled within the values of 140/55mm/Hg or within value directed by their physician.   Intervention Provide nutrition & aerobic exercise along with prescribed medications to  achieve BP 140/90 or less.   Lipids Yes   Goal Cholesterol controlled with medications as prescribed, with individualized exercise RX and with personalized nutrition plan. Value goals: LDL < , HDL > . Participant states understanding of desired cholesterol values and following prescriptions.   Intervention Provide nutrition & aerobic exercise along with prescribed medications to achieve LDL 70mg , HDL >40mg .       Personal Goals Discharge:     Personal Goals at Discharge - 04/06/15 1754    Increase Aerobic Exercise and Physical Activity   Goals Progress/Improvement seen  Yes   Comments Patient discharging early, as he is returning to work.  He and his wife plan to join the [employer] to continue exercising.        Nutrition & Weight - Outcomes:     Pre Biometrics - 03/16/15 1515    Pre Biometrics   Height 5' 8.5" (1.74 m)   Weight 217 lb 14.4 oz (98.839 kg)   Waist Circumference 41.5 inches   Hip Circumference 43.5 inches   Waist to Hip Ratio 0.95 %   BMI (Calculated) 32.7       Nutrition:     Nutrition Therapy & Goals - 03/16/15 1525    Intervention Plan   Intervention Using nutrition plan and personal goals to gain a healthy nutrition lifestyle. Add exercise as prescribed.      Nutrition Discharge:   Education Questionnaire Score:     Knowledge Questionnaire Score - 03/16/15 1521    Knowledge Questionnaire Score   Pre Score 26      Goals reviewed with patient; copy given to patient.

## 2015-04-06 NOTE — Addendum Note (Signed)
Addended by: Suszanne Finch on: 04/06/2015 06:02 PM   Modules accepted: Orders

## 2015-04-06 NOTE — Progress Notes (Signed)
Daily Session Note  Patient Details  Name: Franklin Calderon MRN: [id number] Date of Birth: Dec 09, 1955 Referring Provider:  Almyra Deforest, MD  Encounter Date: 04/06/2015  Check In:     Session Check In - 04/06/15 1636    Check-In   Staff Present Lestine Box BS, ACSM EP-C, Exercise Physiologist;Carroll Enterkin RN, BSN;Diane Joya Gaskins RN, BSN   ER physicians immediately available to respond to emergencies See telemetry face sheet for immediately available ER MD   Medication changes reported     No   Fall or balance concerns reported    No   Warm-up and Cool-down Performed on first and last piece of equipment   VAD Patient? No   Pain Assessment   Currently in Pain? No/denies         Goals Met:  Proper associated with RPD/PD & O2 Sat Exercise tolerated well No report of cardiac concerns or symptoms Strength training completed today  Goals Unmet:  Not Applicable  Goals Comments:    Dr. Emily Filbert is Medical Director for [facility].

## 2015-04-14 ENCOUNTER — Encounter: Payer: Self-pay | Admitting: *Deleted

## 2015-04-14 DIAGNOSIS — Z951 Presence of aortocoronary bypass graft: Secondary | ICD-10-CM

## 2015-04-14 DIAGNOSIS — Z952 Presence of prosthetic heart valve: Secondary | ICD-10-CM

## 2015-04-14 NOTE — Progress Notes (Signed)
Cardiac Individual Treatment Plan  Patient Details  Name: Franklin Calderon MRN: [id number] Date of Birth: [birth year] Referring Provider:  Almyra Deforest, MD  Initial Encounter Date:    Visit Diagnosis: S/P CABG (coronary artery bypass graft)  S/P aortic valve replacement  Patient's Home Medications on Admission:  Current outpatient prescriptions:  .  amiodarone (PACERONE) 200 MG tablet, Take 400 mg by mouth., Disp: , Rfl:  .  aspirin 325 MG tablet, Take 81 mg by mouth., Disp: , Rfl:  .  atorvastatin (LIPITOR) 20 MG tablet, Take 20 mg by mouth., Disp: , Rfl:  .  Co-Enzyme Q-10 30 MG CAPS, Take 100 mg by mouth., Disp: , Rfl:  .  Insulin Detemir (LEVEMIR FLEXPEN [place]), Inject into the skin., Disp: , Rfl:  .  losartan-hydrochlorothiazide (HYZAAR) 50-12.5 MG per tablet, Take by mouth., Disp: , Rfl:  .  metFORMIN (GLUCOPHAGE) 1000 MG tablet, Take 1,000 mg by mouth., Disp: , Rfl:  .  metoprolol succinate (TOPROL-XL) 25 MG 24 hr tablet, Take 25 mg by mouth., Disp: , Rfl:  .  Multiple Vitamin (MULTI-VITAMINS) TABS, Take by mouth., Disp: , Rfl:  .  omega-3 acid ethyl esters (LOVAZA) 1 G capsule, Take by mouth., Disp: , Rfl:  .  pioglitazone (ACTOS) 30 MG tablet, Take 30 mg by mouth., Disp: , Rfl:  .  Vitamin D, Ergocalciferol, (DRISDOL) 50000 UNITS CAPS capsule, Take by mouth., Disp: , Rfl:   Past Medical History: Past Medical History  Diagnosis Date  . Coronary artery disease   . Hypertension   . Hyperlipidemia   . Diabetes mellitus without complication     Tobacco Use: History  Smoking status  . Never Smoker   Smokeless tobacco  . Not on file    Labs: Recent Review Flowsheet Data    There is no flowsheet data to display.       Exercise Target Goals:    Exercise Program Goal: Individual exercise prescription set with THRR, safety & activity barriers. Participant demonstrates ability to understand and report RPE using BORG scale, to self-measure pulse accurately, and to  acknowledge the importance of the exercise prescription.  Exercise Prescription Goal: Starting with aerobic activity 30 plus minutes a day, 3 days per week for initial exercise prescription. Provide home exercise prescription and guidelines that participant acknowledges understanding prior to discharge.  Activity Barriers & Risk Stratification:     Activity Barriers & Risk Stratification - 03/16/15 1510    Activity Barriers & Risk Stratification   Activity Barriers None   Risk Stratification High      6 Minute Walk:     6 Minute Walk      03/16/15 1513       6 Minute Walk   Phase Initial     Distance 1515 feet     Walk Time 6 minutes     Resting HR 85 bpm     Resting BP 124/80 mmHg     Max Ex. HR 102 bpm     Max Ex. BP 124/64 mmHg     RPE 11     Perceived Dyspnea  1     Symptoms No        Initial Exercise Prescription:     Initial Exercise Prescription - 03/16/15 1500    Date of Initial Exercise Prescription   Date 03/16/15   Treadmill   MPH 2.5   Grade 0   Minutes 10   Recumbant Bike   Level 2   RPM 45  Watts 25   Minutes 10   NuStep   Level 2   Watts 50   Minutes 10   Arm Ergometer   Level 1   Watts 10   Minutes 10   Elliptical   Level 1   Speed 3   Minutes 1   REL-XR   Level 2   Watts 50   Minutes 10   Prescription Details   Frequency (times per week) 3   Duration Progress to 30 minutes of continuous aerobic without signs/symptoms of physical distress   Intensity   THRR REST +  20   Ratings of Perceived Exertion 11-15   Perceived Dyspnea 2-4   Progression Continue progressive overload as per policy without signs/symptoms or physical distress.   Resistance Training   Training Prescription Yes   Weight 2   Reps 10-12      Exercise Prescription Changes:     Exercise Prescription Changes      03/27/15 1600           Exercise Review   Progression Yes       Resistance Training   Training Prescription Yes       Weight 3 lbs        Reps 10-12       NuStep   Level 5       Watts 60       Minutes 20       Elliptical   Level 2       Speed 3.5       Minutes 20          Discharge Exercise Prescription (Final Exercise Prescription Changes):     Exercise Prescription Changes - 03/27/15 1600    Exercise Review   Progression Yes   Resistance Training   Training Prescription Yes   Weight 3 lbs   Reps 10-12   NuStep   Level 5   Watts 60   Minutes 20   Elliptical   Level 2   Speed 3.5   Minutes 20      Nutrition:  Target Goals: Understanding of nutrition guidelines, daily intake of sodium <1567m, cholesterol <204m calories 30% from fat and 7% or less from saturated fats, daily to have 5 or more servings of fruits and vegetables.  Biometrics:     Pre Biometrics - 03/16/15 1515    Pre Biometrics   Height 5' 8.5" (1.74 m)   Weight 217 lb 14.4 oz (98.839 kg)   Waist Circumference 41.5 inches   Hip Circumference 43.5 inches   Waist to Hip Ratio 0.95 %   BMI (Calculated) 32.7       Nutrition Therapy Plan and Nutrition Goals:     Nutrition Therapy & Goals - 03/16/15 1525    Intervention Plan   Intervention Using nutrition plan and personal goals to gain a healthy nutrition lifestyle. Add exercise as prescribed.      Nutrition Discharge: Rate Your Plate Scores:   Nutrition Goals Re-Evaluation:   Psychosocial: Target Goals: Acknowledge presence or absence of depression, maximize coping skills, provide positive support system. Participant is able to verbalize types and ability to use techniques and skills needed for reducing stress and depression.  Initial Review & Psychosocial Screening:     Initial Psych Review & Screening - 03/16/15 15EmbdenYes   Barriers   Psychosocial barriers to participate in program There are no identifiable barriers or psychosocial needs.  Screening Interventions   Interventions Encouraged to exercise       Quality of Life Scores:     Quality of Life - 03/16/15 1520    Quality of Life Scores   Health/Function Pre 26.4 %   Socioeconomic Pre 28.63 %   Psych/Spiritual Pre 23.57 %   Family Pre 28.8 %   GLOBAL Pre 26.69 %      PHQ-9:     Recent Review Flowsheet Data    Depression screen [facility] 2/9 03/16/2015   Decreased Interest 1   Down, Depressed, Hopeless 0   PHQ - 2 Score 1   Altered sleeping 1   Tired, decreased energy 1   Change in appetite 1   Feeling bad or failure about yourself  0   Trouble concentrating 0   Moving slowly or fidgety/restless 0   Suicidal thoughts 0   PHQ-9 Score 4   Difficult doing work/chores Somewhat difficult      Psychosocial Evaluation and Intervention:     Psychosocial Evaluation - 03/22/15 1657    Psychosocial Evaluation & Interventions   Interventions Stress management education;Relaxation education;Encouraged to exercise with the program and follow exercise prescription   Comments Counselor met with Mr. Phillipson today for initial psychosocial evaluation.  He is a 59 year old who had triple by-pass surgery earlier this year and suffers from diabetes and high blood pressure as well.  Mr. Loni Muse has a strong support system with a spouse of 50 years and several adult children who either live close by or check in on him regularly.  He states he sleeps well and his appetite has returned subsequent to a stomach virus a few weeks back.  Mr. A denies a history of depression or anxiety or any current symptoms.  His mood is typically positive and he has goals to get back into shape and increase his stamina and strength.  He has some upcoming traveling to do with his job, so stated he would be inconsistent in attending class.  Counselor recommended that he work out consistently while away to maintain his progress and accomplish his goals.        Psychosocial Re-Evaluation:   Vocational Rehabilitation: Provide vocational rehab assistance to qualifying  candidates.   Vocational Rehab Evaluation & Intervention:     Vocational Rehab - 03/16/15 1523    Initial Vocational Rehab Evaluation & Intervention   Assessment shows need for Vocational Rehabilitation No      Education: Education Goals: Education classes will be provided on a weekly basis, covering required topics. Participant will state understanding/return demonstration of topics presented.  Learning Barriers/Preferences:     Learning Barriers/Preferences - 03/16/15 1512    Learning Barriers/Preferences   Learning Barriers None   Learning Preferences Written Material      Education Topics: General Nutrition Guidelines/Fats and Fiber: -Group instruction provided by verbal, written material, models and posters to present the general guidelines for heart healthy nutrition. Gives an explanation and review of dietary fats and fiber.   Controlling Sodium/Reading Food Labels: -Group verbal and written material supporting the discussion of sodium use in heart healthy nutrition. Review and explanation with models, verbal and written materials for utilization of the food label.   Exercise Physiology & Risk Factors: - Group verbal and written instruction with models to review the exercise physiology of the cardiovascular system and associated critical values. Details cardiovascular disease risk factors and the goals associated with each risk factor.   Aerobic Exercise & Resistance Training: - Gives  group verbal and written discussion on the health impact of inactivity. On the components of aerobic and resistive training programs and the benefits of this training and how to safely progress through these programs.          Cardiac Rehab from 04/03/2015 in [facility] Cardiac Rehab   Date  03/20/15   Educator  SW   Instruction Review Code  2- meets goals/outcomes      Flexibility, Balance, General Exercise Guidelines: - Provides group verbal and written instruction on the benefits of  flexibility and balance training programs. Provides general exercise guidelines with specific guidelines to those with heart or lung disease. Demonstration and skill practice provided.      Cardiac Rehab from 04/03/2015 in [facility] Cardiac Rehab   Date  03/27/15   Educator  KM   Instruction Review Code  2- meets goals/outcomes      Stress Management: - Provides group verbal and written instruction about the health risks of elevated stress, cause of high stress, and healthy ways to reduce stress.      Cardiac Rehab from 04/03/2015 in [facility] Cardiac Rehab   Date  03/29/15   Educator  [facility]   Instruction Review Code  2- meets goals/outcomes      Depression: - Provides group verbal and written instruction on the correlation between heart/lung disease and depressed mood, treatment options, and the stigmas associated with seeking treatment.   Anatomy & Physiology of the Heart: - Group verbal and written instruction and models provide basic cardiac anatomy and physiology, with the coronary electrical and arterial systems. Review of: AMI, Angina, Valve disease, Heart Failure, Cardiac Arrhythmia, Pacemakers, and the ICD.      Cardiac Rehab from 04/03/2015 in [facility] Cardiac Rehab   Date  04/03/15   Educator  SB   Instruction Review Code  2- meets goals/outcomes      Cardiac Procedures: - Group verbal and written instruction and models to describe the testing methods done to diagnose heart disease. Reviews the outcomes of the test results. Describes the treatment choices: Medical Management, Angioplasty, or Coronary Bypass Surgery.   Cardiac Medications: - Group verbal and written instruction to review commonly prescribed medications for heart disease. Reviews the medication, class of the drug, and side effects. Includes the steps to properly store meds and maintain the prescription regimen.   Go Sex-Intimacy & Heart Disease, Get SMART - Goal Setting: - Group verbal and written instruction through game  format to discuss heart disease and the return to sexual intimacy. Provides group verbal and written material to discuss and apply goal setting through the application of the S.M.A.R.T. Method.   Other Matters of the Heart: - Provides group verbal, written materials and models to describe Heart Failure, Angina, Valve Disease, and Diabetes in the realm of heart disease. Includes description of the disease process and treatment options available to the cardiac patient.   Exercise & Equipment Safety: - Individual verbal instruction and demonstration of equipment use and safety with use of the equipment.      Cardiac Rehab from 04/03/2015 in [facility] Cardiac Rehab   Date  03/16/15   Educator  C. Enterkin,RN   Instruction Review Code  1- partially meets, needs review/practice      Infection Prevention: - Provides verbal and written material to individual with discussion of infection control including proper hand washing and proper equipment cleaning during exercise session.      Cardiac Rehab from 04/03/2015 in [facility] Cardiac Rehab   Date  03/16/15   Educator  C. Enterkin,RN   Instruction Review Code  2- meets goals/outcomes      Falls Prevention: - Provides verbal and written material to individual with discussion of falls prevention and safety.      Cardiac Rehab from 04/03/2015 in [facility] Cardiac Rehab   Date  03/16/15   Educator  C. Enterkin,RN   Instruction Review Code  2- meets goals/outcomes      Diabetes: - Individual verbal and written instruction to review signs/symptoms of diabetes, desired ranges of glucose level fasting, after meals and with exercise. Advice that pre and post exercise glucose checks will be done for 3 sessions at entry of program.      Cardiac Rehab from 04/03/2015 in [facility] Cardiac Rehab   Date  03/16/15   Educator  C. Enterkin,RN   Instruction Review Code  1- partially meets, needs review/practice       Knowledge Questionnaire Score:     Knowledge Questionnaire  Score - 03/16/15 1521    Knowledge Questionnaire Score   Pre Score 26      Personal Goals and Risk Factors at Admission:     Personal Goals and Risk Factors at Admission - 03/16/15 1525    Personal Goals and Risk Factors on Admission   Increase Aerobic Exercise and Physical Activity Yes   Intervention While in program, learn and follow the exercise prescription taught. Start at a low level workload and increase workload after able to maintain previous level for 30 minutes. Increase time before increasing intensity.   Diabetes Yes   Goal Blood glucose control identified by blood glucose values, HgbA1C. Participant verbalizes understanding of the signs/symptoms of hyper/hypo glycemia, proper foot care and importance of medication and nutrition plan for blood glucose control.   Intervention Provide nutrition & aerobic exercise along with prescribed medications to achieve blood glucose in normal ranges: Fasting 65-99 mg/dL   Hypertension Yes   Goal Participant will see blood pressure controlled within the values of 140/76m/Hg or within value directed by their physician.   Intervention Provide nutrition & aerobic exercise along with prescribed medications to achieve BP 140/90 or less.   Lipids Yes   Goal Cholesterol controlled with medications as prescribed, with individualized exercise RX and with personalized nutrition plan. Value goals: LDL < 714m HDL > 4017mParticipant states understanding of desired cholesterol values and following prescriptions.   Intervention Provide nutrition & aerobic exercise along with prescribed medications to achieve LDL <32m31mDL >40mg32m   Personal Goals and Risk Factors Review:    Personal Goals Discharge:      Personal Goals at Discharge - 04/06/15 1754    Increase Aerobic Exercise and Physical Activity   Goals Progress/Improvement seen  Yes   Comments Patient discharging early, as he is returning to work.  [facility]         Comments: 30 day review discharged 04/05/2105

## 2015-05-15 ENCOUNTER — Encounter: Payer: Self-pay | Admitting: Dietician

## 2016-12-01 DEATH — deceased

## 2019-02-23 ENCOUNTER — Other Ambulatory Visit: Payer: Self-pay | Admitting: Internal Medicine

## 2019-02-23 DIAGNOSIS — Z20822 Contact with and (suspected) exposure to covid-19: Secondary | ICD-10-CM

## 2019-02-27 LAB — NOVEL CORONAVIRUS, NAA: SARS-CoV-2, NAA: NOT DETECTED

## 2021-11-03 ENCOUNTER — Inpatient Hospital Stay (HOSPITAL_COMMUNITY): Payer: BC Managed Care – PPO

## 2021-11-03 ENCOUNTER — Emergency Department (HOSPITAL_COMMUNITY): Payer: BC Managed Care – PPO

## 2021-11-03 ENCOUNTER — Encounter (HOSPITAL_COMMUNITY): Payer: Self-pay | Admitting: Emergency Medicine

## 2021-11-03 ENCOUNTER — Other Ambulatory Visit: Payer: Self-pay

## 2021-11-03 ENCOUNTER — Inpatient Hospital Stay (HOSPITAL_COMMUNITY)
Admission: EM | Admit: 2021-11-03 | Discharge: 2021-11-06 | DRG: 062 | Disposition: A | Discharge status: Home / Self Care | Payer: BC Managed Care – PPO | Attending: Neurology | Admitting: Neurology

## 2021-11-03 DIAGNOSIS — I161 Hypertensive emergency: Secondary | ICD-10-CM | POA: Diagnosis not present

## 2021-11-03 DIAGNOSIS — I2510 Atherosclerotic heart disease of native coronary artery without angina pectoris: Secondary | ICD-10-CM | POA: Diagnosis present

## 2021-11-03 DIAGNOSIS — R4701 Aphasia: Secondary | ICD-10-CM

## 2021-11-03 DIAGNOSIS — Z885 Allergy status to narcotic agent status: Secondary | ICD-10-CM | POA: Diagnosis not present

## 2021-11-03 DIAGNOSIS — I160 Hypertensive urgency: Secondary | ICD-10-CM | POA: Diagnosis present

## 2021-11-03 DIAGNOSIS — I10 Essential (primary) hypertension: Secondary | ICD-10-CM | POA: Diagnosis present

## 2021-11-03 DIAGNOSIS — I674 Hypertensive encephalopathy: Secondary | ICD-10-CM | POA: Diagnosis present

## 2021-11-03 DIAGNOSIS — Z953 Presence of xenogenic heart valve: Secondary | ICD-10-CM | POA: Diagnosis not present

## 2021-11-03 DIAGNOSIS — I639 Cerebral infarction, unspecified: Secondary | ICD-10-CM | POA: Diagnosis present

## 2021-11-03 DIAGNOSIS — E1165 Type 2 diabetes mellitus with hyperglycemia: Secondary | ICD-10-CM | POA: Diagnosis present

## 2021-11-03 DIAGNOSIS — Z888 Allergy status to other drugs, medicaments and biological substances status: Secondary | ICD-10-CM | POA: Diagnosis not present

## 2021-11-03 DIAGNOSIS — Z20822 Contact with and (suspected) exposure to covid-19: Secondary | ICD-10-CM | POA: Diagnosis present

## 2021-11-03 DIAGNOSIS — E782 Mixed hyperlipidemia: Secondary | ICD-10-CM | POA: Diagnosis not present

## 2021-11-03 DIAGNOSIS — K219 Gastro-esophageal reflux disease without esophagitis: Secondary | ICD-10-CM | POA: Diagnosis present

## 2021-11-03 DIAGNOSIS — Z6837 Body mass index (BMI) 37.0-37.9, adult: Secondary | ICD-10-CM

## 2021-11-03 DIAGNOSIS — G459 Transient cerebral ischemic attack, unspecified: Principal | ICD-10-CM | POA: Diagnosis present

## 2021-11-03 DIAGNOSIS — R29701 NIHSS score 1: Secondary | ICD-10-CM | POA: Diagnosis present

## 2021-11-03 DIAGNOSIS — E785 Hyperlipidemia, unspecified: Secondary | ICD-10-CM | POA: Diagnosis present

## 2021-11-03 DIAGNOSIS — E1159 Type 2 diabetes mellitus with other circulatory complications: Secondary | ICD-10-CM | POA: Diagnosis not present

## 2021-11-03 DIAGNOSIS — F419 Anxiety disorder, unspecified: Secondary | ICD-10-CM | POA: Diagnosis not present

## 2021-11-03 DIAGNOSIS — Z951 Presence of aortocoronary bypass graft: Secondary | ICD-10-CM | POA: Diagnosis not present

## 2021-11-03 DIAGNOSIS — R451 Restlessness and agitation: Secondary | ICD-10-CM | POA: Diagnosis present

## 2021-11-03 DIAGNOSIS — I951 Orthostatic hypotension: Secondary | ICD-10-CM | POA: Diagnosis present

## 2021-11-03 DIAGNOSIS — R471 Dysarthria and anarthria: Secondary | ICD-10-CM

## 2021-11-03 DIAGNOSIS — I6389 Other cerebral infarction: Secondary | ICD-10-CM | POA: Diagnosis not present

## 2021-11-03 LAB — HEMOGLOBIN A1C
Hgb A1c MFr Bld: 6.5 % — ABNORMAL HIGH (ref 4.8–5.6)
Mean Plasma Glucose: 139.85 mg/dL

## 2021-11-03 LAB — COMPREHENSIVE METABOLIC PANEL
ALT: 18 U/L (ref 0–44)
AST: 19 U/L (ref 15–41)
Albumin: 4 g/dL (ref 3.5–5.0)
Alkaline Phosphatase: 59 U/L (ref 38–126)
Anion gap: 8 (ref 5–15)
BUN: 13 mg/dL (ref 8–23)
CO2: 25 mmol/L (ref 22–32)
Calcium: 9.1 mg/dL (ref 8.9–10.3)
Chloride: 100 mmol/L (ref 98–111)
Creatinine, Ser: 0.99 mg/dL (ref 0.61–1.24)
GFR, Estimated: >60 mL/min (ref >60)
Glucose, Bld: 111 mg/dL — ABNORMAL HIGH (ref 70–99)
Potassium: 4.5 mmol/L (ref 3.5–5.1)
Sodium: 133 mmol/L — ABNORMAL LOW (ref 135–145)
Total Bilirubin: 0.6 mg/dL (ref 0.3–1.2)
Total Protein: 6.7 g/dL (ref 6.5–8.1)

## 2021-11-03 LAB — I-STAT CHEM 8, ED
BUN: 15 mg/dL (ref 8–23)
Calcium, Ion: 1.13 mmol/L — ABNORMAL LOW (ref 1.15–1.40)
Chloride: 98 mmol/L (ref 98–111)
Creatinine, Ser: 0.9 mg/dL (ref 0.61–1.24)
Glucose, Bld: 106 mg/dL — ABNORMAL HIGH (ref 70–99)
HCT: 49 % (ref 39.0–52.0)
Hemoglobin: 16.7 g/dL (ref 13.0–17.0)
Potassium: 4.5 mmol/L (ref 3.5–5.1)
Sodium: 133 mmol/L — ABNORMAL LOW (ref 135–145)
TCO2: 26 mmol/L (ref 22–32)

## 2021-11-03 LAB — GLUCOSE, CAPILLARY
Glucose-Capillary: 155 mg/dL — ABNORMAL HIGH (ref 70–99)
Glucose-Capillary: 199 mg/dL — ABNORMAL HIGH (ref 70–99)

## 2021-11-03 LAB — CBC
HCT: 47.1 % (ref 39.0–52.0)
Hemoglobin: 15.9 g/dL (ref 13.0–17.0)
MCH: 31.8 pg (ref 26.0–34.0)
MCHC: 33.8 g/dL (ref 30.0–36.0)
MCV: 94.2 fL (ref 80.0–100.0)
Platelets: 258 10*3/uL (ref 150–400)
RBC: 5 MIL/uL (ref 4.22–5.81)
RDW: 13.2 % (ref 11.5–15.5)
WBC: 8.2 10*3/uL (ref 4.0–10.5)
nRBC: 0 % (ref 0.0–0.2)

## 2021-11-03 LAB — DIFFERENTIAL
Abs Immature Granulocytes: 0.03 10*3/uL (ref 0.00–0.07)
Basophils Absolute: 0 10*3/uL (ref 0.0–0.1)
Basophils Relative: 0 %
Eosinophils Absolute: 0.1 10*3/uL (ref 0.0–0.5)
Eosinophils Relative: 1 %
Immature Granulocytes: 0 %
Lymphocytes Relative: 44 %
Lymphs Abs: 3.6 10*3/uL (ref 0.7–4.0)
Monocytes Absolute: 0.5 10*3/uL (ref 0.1–1.0)
Monocytes Relative: 7 %
Neutro Abs: 3.9 10*3/uL (ref 1.7–7.7)
Neutrophils Relative %: 48 %

## 2021-11-03 LAB — PROTIME-INR
INR: 1.1 (ref 0.8–1.2)
Prothrombin Time: 13.8 seconds (ref 11.4–15.2)

## 2021-11-03 LAB — RESP PANEL BY RT-PCR (FLU A&B, COVID) ARPGX2
Influenza A by PCR: NEGATIVE
Influenza B by PCR: NEGATIVE
SARS Coronavirus 2 by RT PCR: NEGATIVE

## 2021-11-03 LAB — APTT: aPTT: 29 seconds (ref 24–36)

## 2021-11-03 LAB — CBG MONITORING, ED: Glucose-Capillary: 109 mg/dL — ABNORMAL HIGH (ref 70–99)

## 2021-11-03 LAB — MRSA NEXT GEN BY PCR, NASAL: MRSA by PCR Next Gen: NOT DETECTED

## 2021-11-03 IMAGING — CT CT ANGIO HEAD-NECK (W OR W/O PERF)
3 of 7 series · 9 of 34 positions shown · IV contrast (OMNI 350)
Comparison: Noncontrast head CT performed earlier the same day.

CLINICAL DATA: Provided history: Neuro deficit, acute, stroke
suspected.

EXAM:
CT ANGIOGRAPHY HEAD AND NECK
TECHNIQUE: Multidetector CT imaging of the head and neck was performed using
the standard protocol during bolus administration of intravenous
contrast. Multiplanar CT image reconstructions and MIPs were
obtained to evaluate the vascular anatomy. Carotid stenosis
measurements (when applicable) are obtained utilizing NASCET
criteria, using the distal internal carotid diameter as the
denominator.

[Series 5: cta neck · axial · 0.42mm/px · z∈[-163,-51]mm · 2 of 170 slices shown]
[im 57/170  soft-tissue]
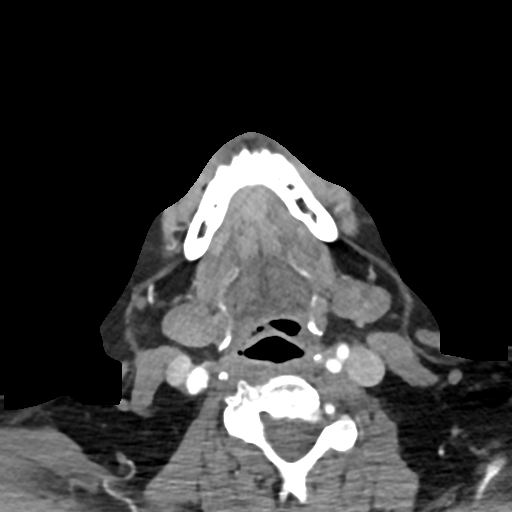
[im 113/170  soft-tissue]
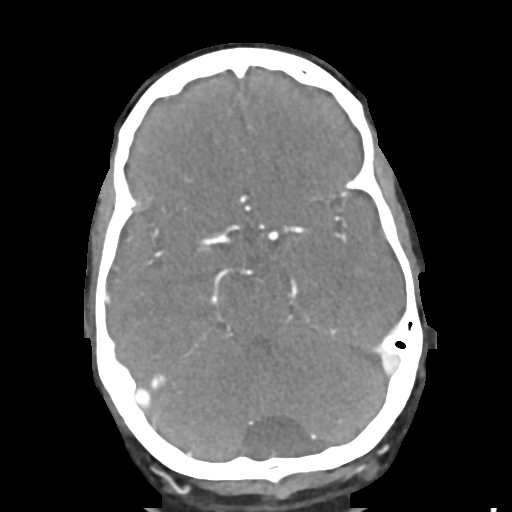

[Series 7: cta neck axial · axial · 0.39mm/px · z∈[-241,-3]mm · 6 of 339 slices shown]
[im 49/339  soft-tissue]
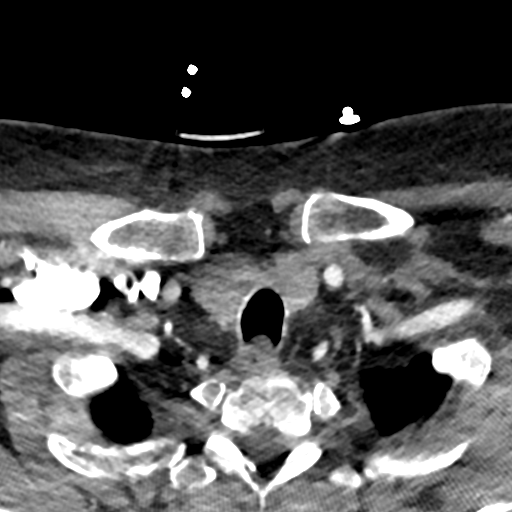
[im 97/339  bone]
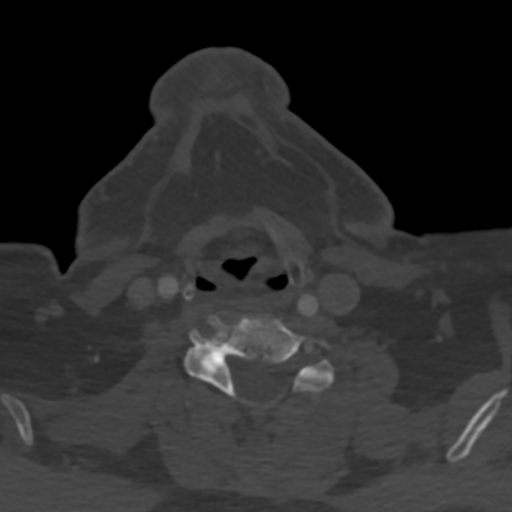
[im 145/339  soft-tissue]
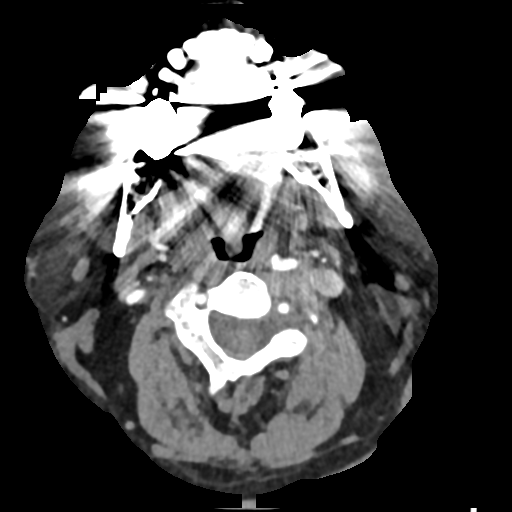
[im 194/339  bone]
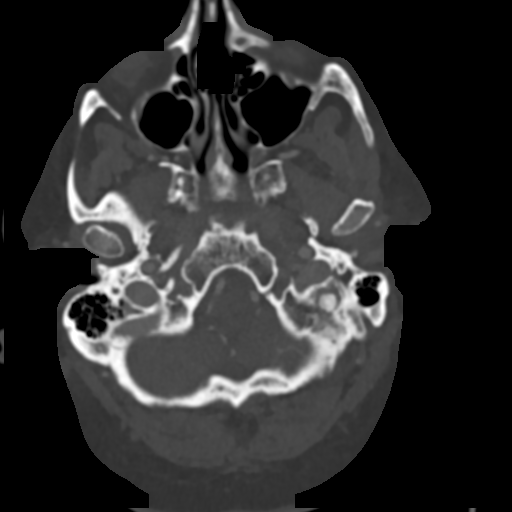
[im 242/339  soft-tissue]
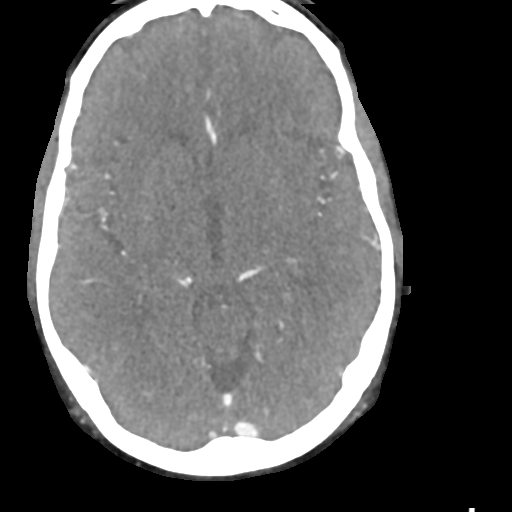
[im 290/339  bone]
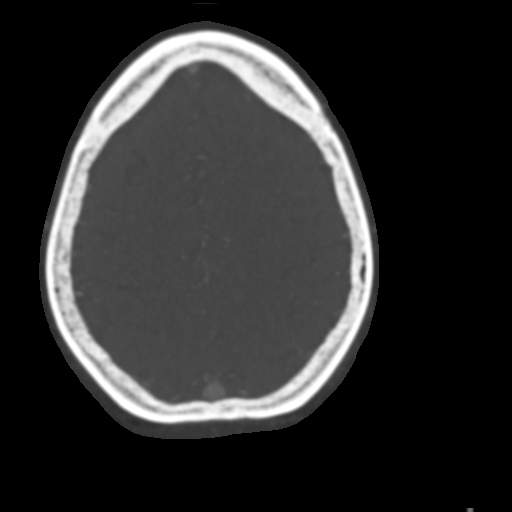

[Series 9: cta neck sagittal · sagittal · 0.44mm/px · 1 of 157 slices shown]
[im 5/157  soft-tissue]
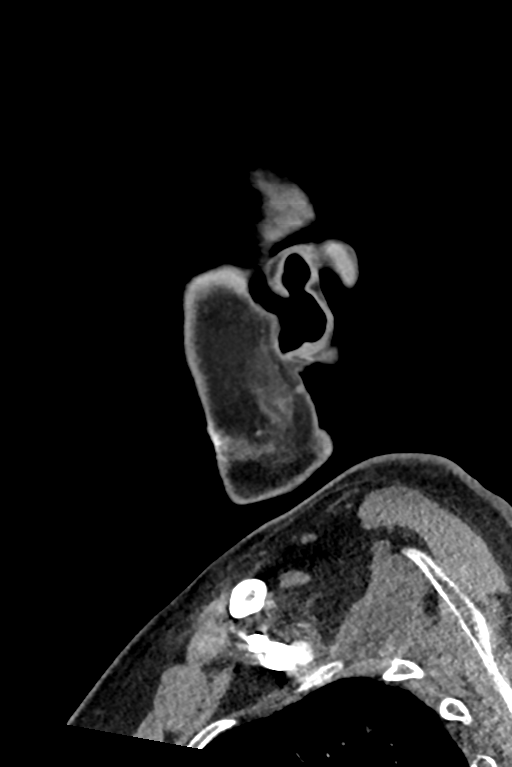

[9 of 34 positions shown; findings below may reference images not displayed]

RADIATION DOSE REDUCTION: This exam was performed according to the
departmental dose-optimization program which includes automated
exposure control, adjustment of the mA and/or kV according to
patient size and/or use of iterative reconstruction technique.

CONTRAST:  Administered contrast not known at this time.
FINDINGS: CTA NECK FINDINGS

Aortic arch: Measured aortic branching. Atherosclerotic plaque
within the visualized aortic arch and proximal major branch vessels
of the neck. No hemodynamically significant innominate or proximal
subclavian artery stenosis.

Right carotid system: CCA and ICA patent within the neck without
significant stenosis (50% or greater). Mild atherosclerotic plaque
about the carotid bifurcation and within the proximal ICA.
Tortuosity of the cervical ICA.

Left carotid system: CCA and ICA patent within the neck without
significant stenosis (50% or greater). Mild atherosclerotic plaque
about the carotid bifurcation and within the proximal ICA.

Vertebral arteries: Vertebral arteries codominant and patent within
the neck without significant stenosis. Minimal nonstenotic calcified
plaque within the proximal left vertebral artery.

Skeleton: No acute bony abnormality or aggressive osseous lesion.
Cervical spondylosis. Cervical levocurvature with partially imaged
thoracic dextrocurvature.

Other neck: No neck mass or cervical lymphadenopathy.

Upper chest: Prior median sternotomy. No consolidation within the
imaged lung apices.

Review of the MIP images confirms the above findings

CTA HEAD FINDINGS

Anterior circulation:

The intracranial internal carotid arteries are patent. Calcified
plaque within both vessels. No more than mild stenosis within the
intracranial right ICA. Up to moderate stenosis within the cavernous
left ICA. The M1 middle cerebral arteries are patent. No M2 proximal
branch occlusion is identified. Atherosclerotic irregularity of the
M2 and more distal MCA vessels, bilaterally. Most notably, there is
a site of severe stenosis within a mid M2 right MCA vessel (series
12, image 8) and a site of apparent severe stenosis within a
superior division proximal M2 left MCA vessel (series 12, image 23).
The anterior cerebral arteries are patent. No intracranial aneurysm
is identified.

Posterior circulation:

The intracranial vertebral arteries are patent. Calcified plaque
within the intracranial vertebral arteries, bilaterally with no more
than mild stenosis. The basilar artery is patent. The posterior
cerebral arteries are patent. A small right posterior communicating
artery is present. The left posterior communicating artery is
diminutive or absent.

Venous sinuses: Within the limitations of contrast timing, no
convincing thrombus.

Anatomic variants: As described.

Review of the MIP images confirms the above findings

No emergent large vessel occlusion identified. These results were
communicated to Dr. VIJI [DATE]by text page via the AMION
messaging system.
IMPRESSION: CTA neck:

1. The common carotid, internal carotid and vertebral arteries are
patent within the neck without hemodynamically significant stenosis.
Mild atherosclerotic plaque about the carotid bifurcations and
within the proximal ICAs, and also within the proximal left
vertebral artery.
2.  Aortic Atherosclerosis ([G4]-[G4]).

CTA head:

1. No intracranial large vessel occlusion is identified.
2. Intracranial atherosclerotic disease with multifocal stenoses,
most notably as follows.
3. Up to moderate stenosis within the cavernous left ICA.
4. Site of severe stenosis within a mid M2 right MCA vessel.
5. Site of severe stenosis within a proximal M2 left MCA vessel.

## 2021-11-03 IMAGING — CT CT HEAD W/O CM
4 of 5 series · 15 of 47 positions shown, 17 images · non-contrast
Comparison: Previous study done earlier today

CLINICAL DATA: CVA



[Series 3: head without · axial · non-contrast · 0.48mm/px · z∈[-88,+32]mm · 5 of 36 slices shown, 7 images]
[im 6/36  brain]
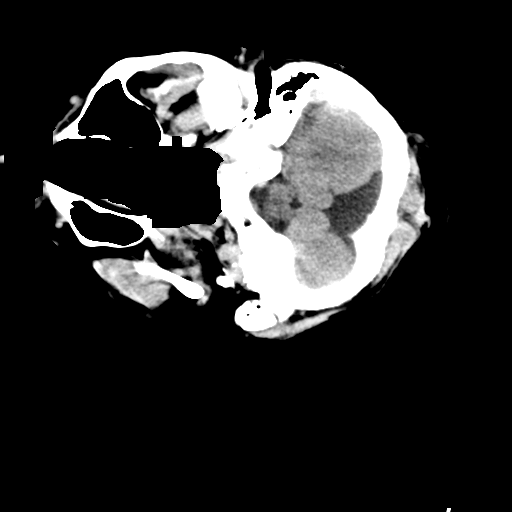
[im 6/36  bone]
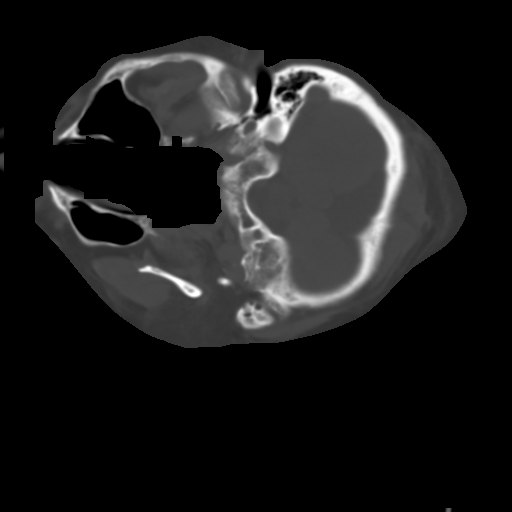
[im 12/36  brain]
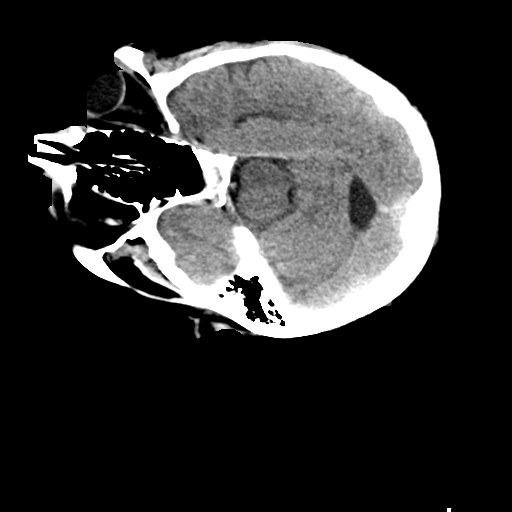
[im 18/36  brain]
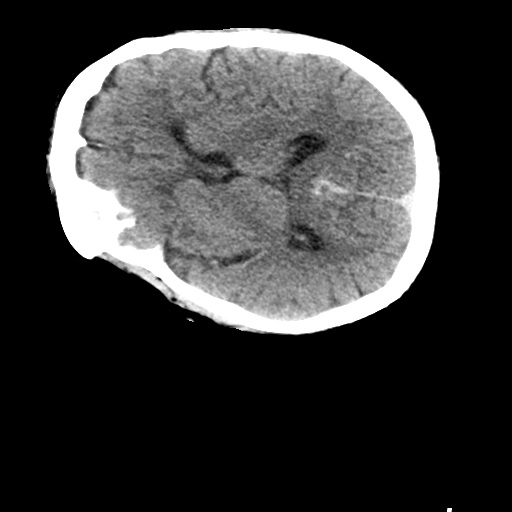
[im 24/36  brain]
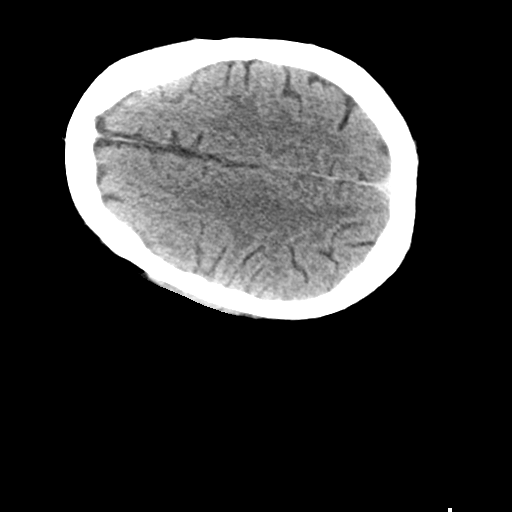
[im 30/36  brain]
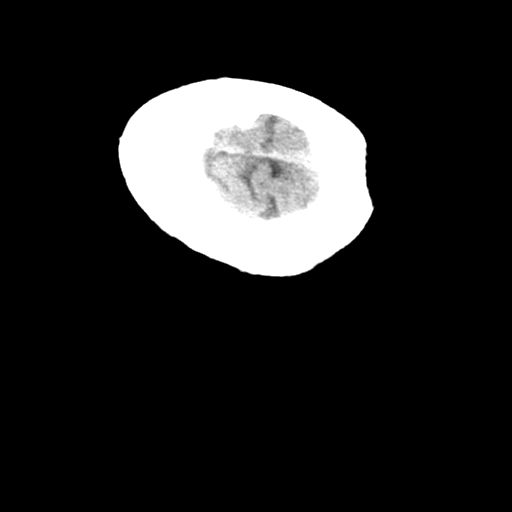
[im 30/36  bone]
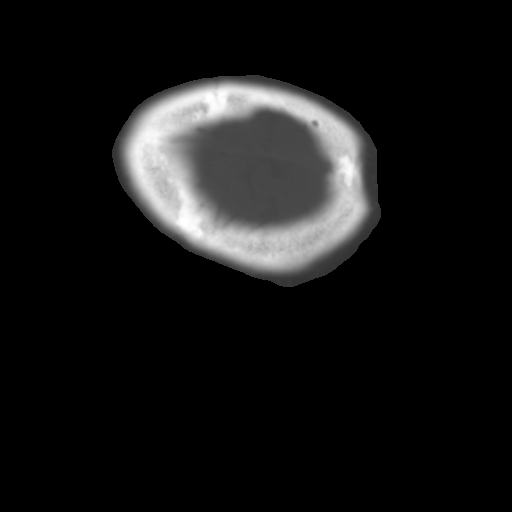

[Series 4: head without ax · axial · non-contrast · 0.48mm/px · z∈[-4,+86]mm · 4 of 34 slices shown]
[im 7/34  brain]
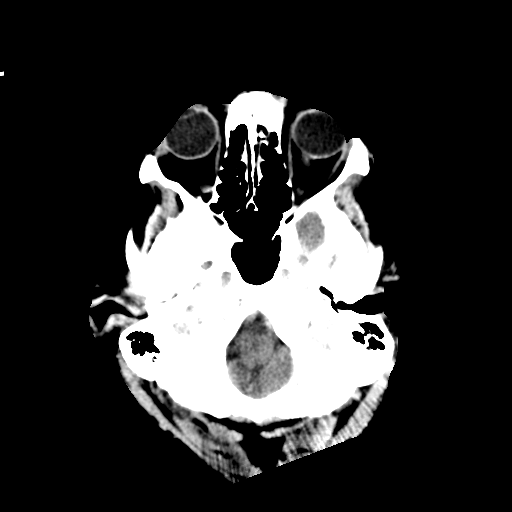
[im 14/34  brain]
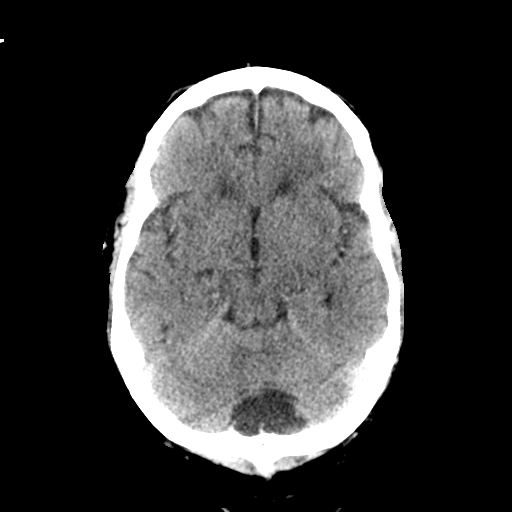
[im 20/34  brain]
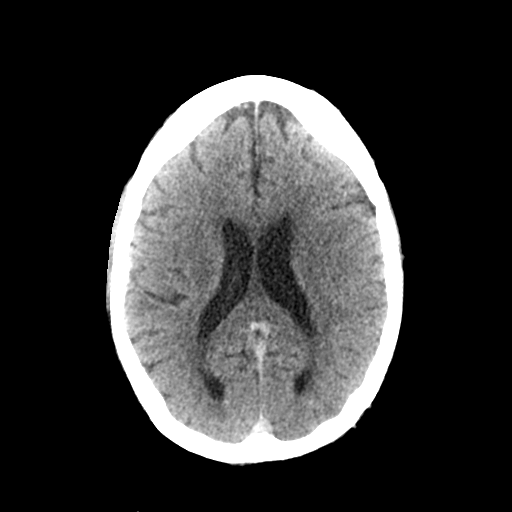
[im 27/34  brain]
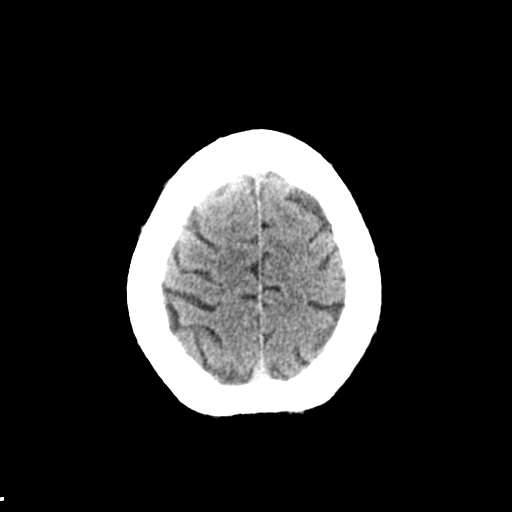

[Series 6: head without sag · coronal · non-contrast · 0.34mm/px · 3 of 69 slices shown]
[im 23/69  brain]
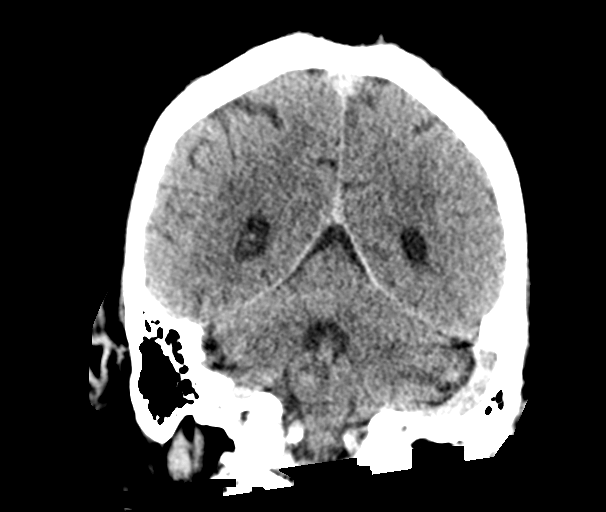
[im 31/69  brain]
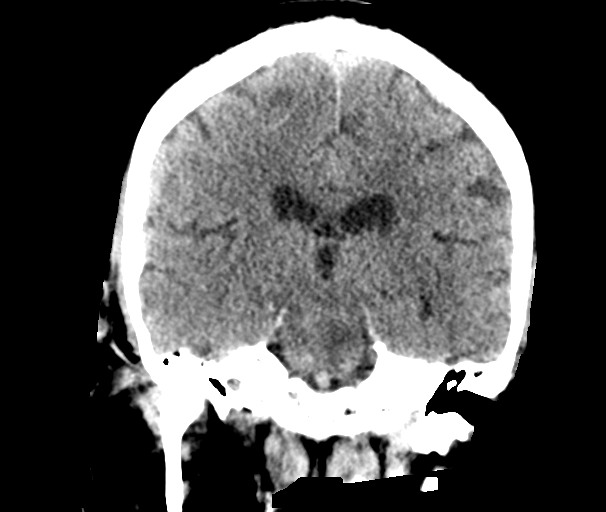
[im 38/69  brain]
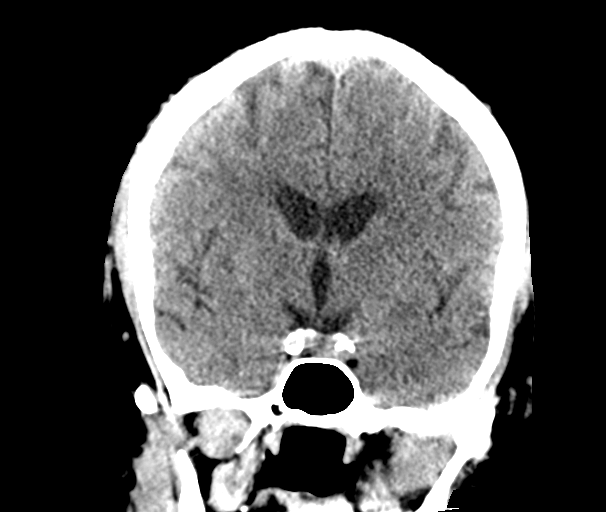

[Series 7: head without cor · sagittal · non-contrast · 0.34mm/px · 3 of 67 slices shown]
[im 29/67  brain]
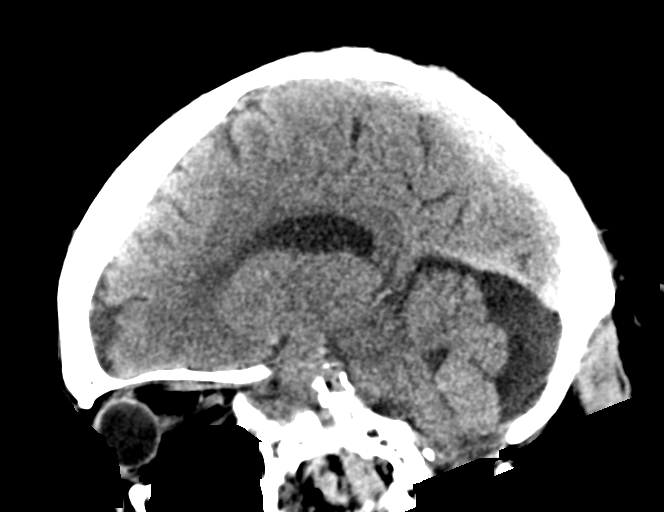
[im 34/67  brain]
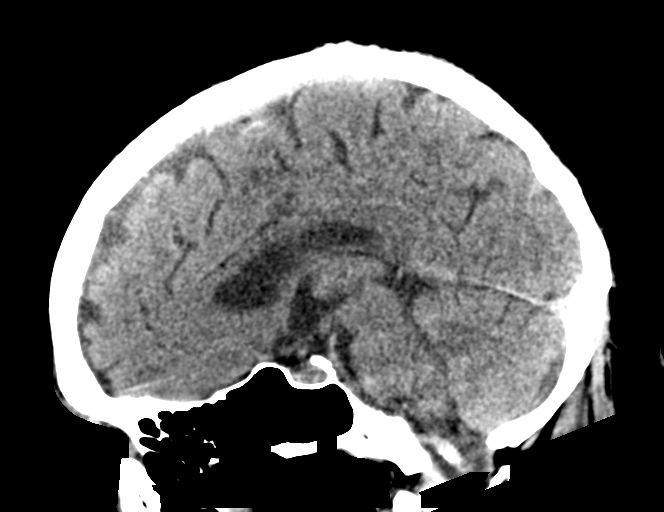
[im 39/67  brain]
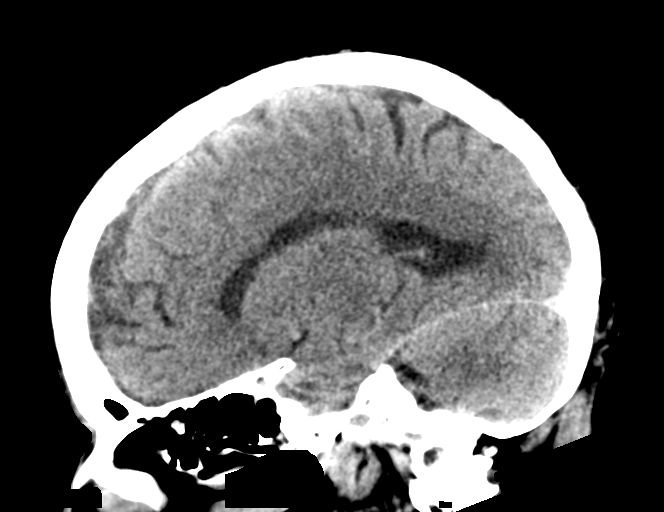

[15 of 47 positions shown; findings below may reference images not displayed]

FINDINGS: Brain: No acute intracranial findings are seen. Ventricles are not
dilated. There is no shift of midline structures. Cortical sulci are
prominent. Cisterna magna is prominent.

Vascular: Unremarkable.

Skull: Unremarkable.

Sinuses/Orbits: There is mild mucosal thickening in the ethmoid
sinus.

Other: None
IMPRESSION: No acute intracranial findings are seen in noncontrast CT brain.
Atrophy.

## 2021-11-03 IMAGING — CT CT HEAD CODE STROKE
4 series · 15 of 47 positions shown, 17 images · non-contrast
Comparison: No pertinent prior exams available for comparison.

CLINICAL DATA: Code stroke. Neuro deficit, acute, stroke suspected.



[Series 2: head 5.0 st · axial · 0.45mm/px · z∈[-71,+44]mm · 7 of 31 slices shown, 9 images]
[im 4/31  brain]
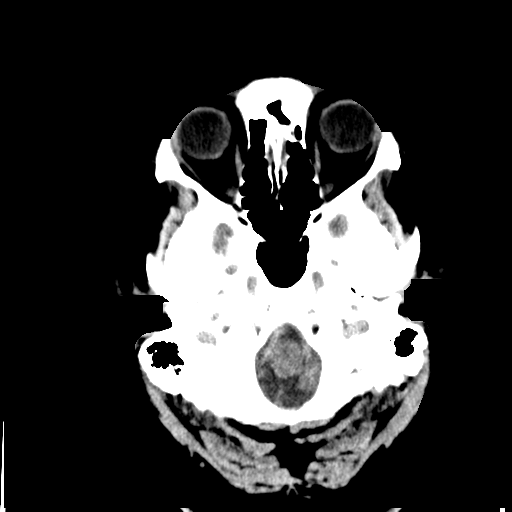
[im 4/31  bone]
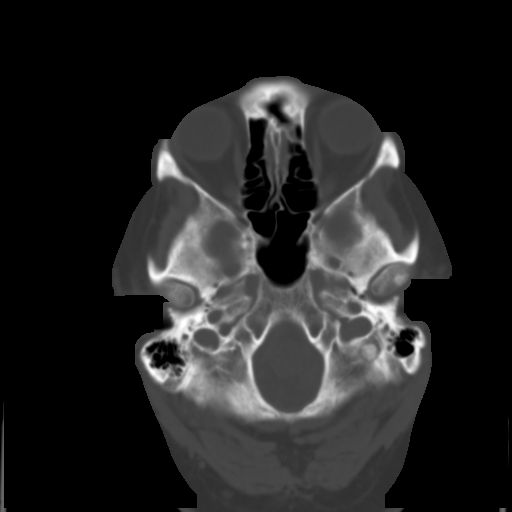
[im 8/31  brain]
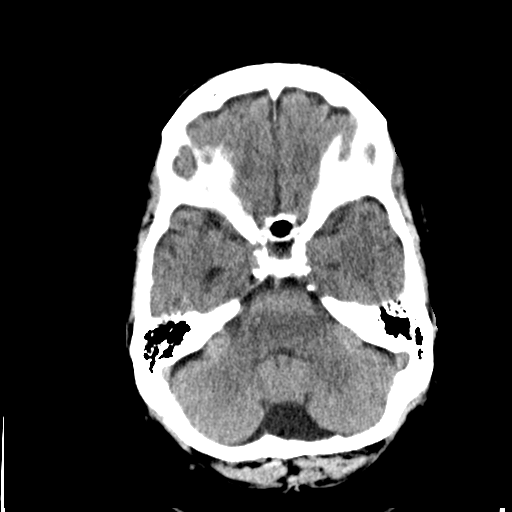
[im 12/31  brain]
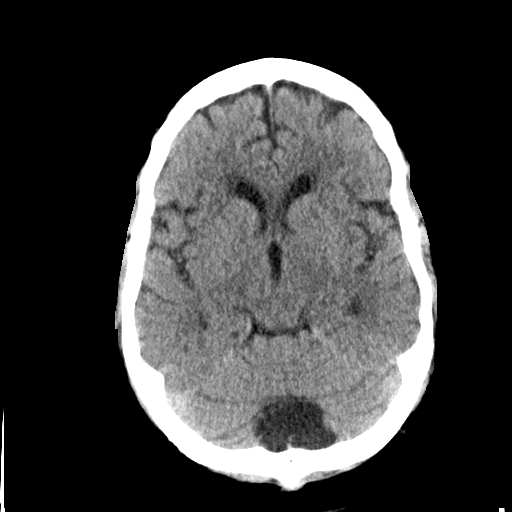
[im 16/31  brain]
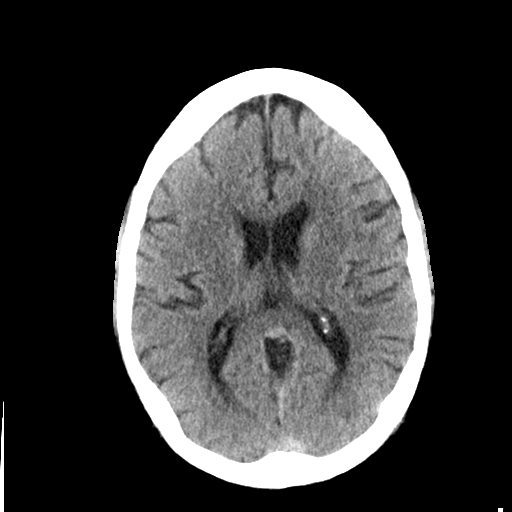
[im 19/31  brain]
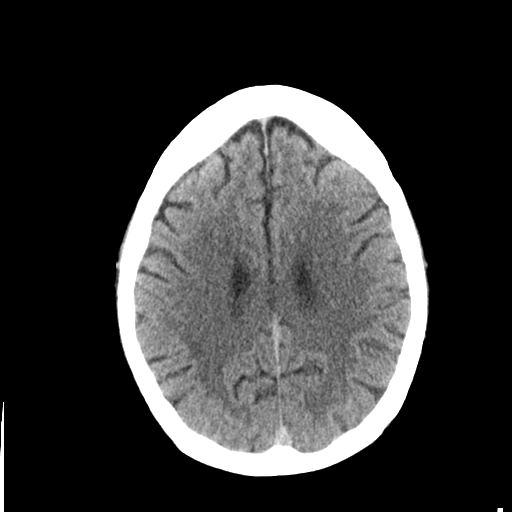
[im 19/31  bone]
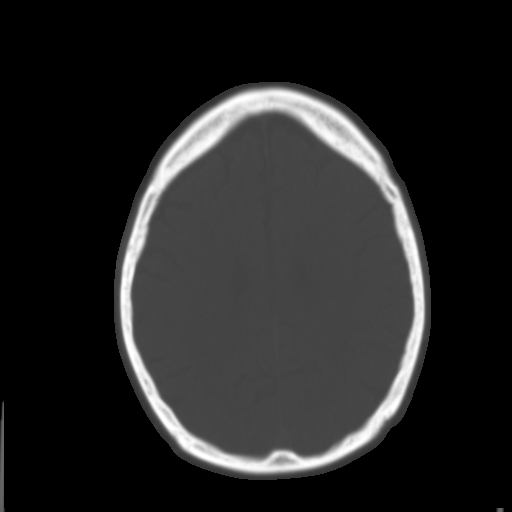
[im 23/31  brain]
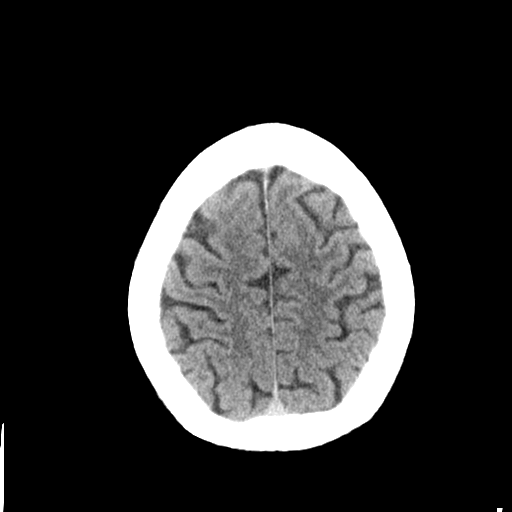
[im 27/31  brain]
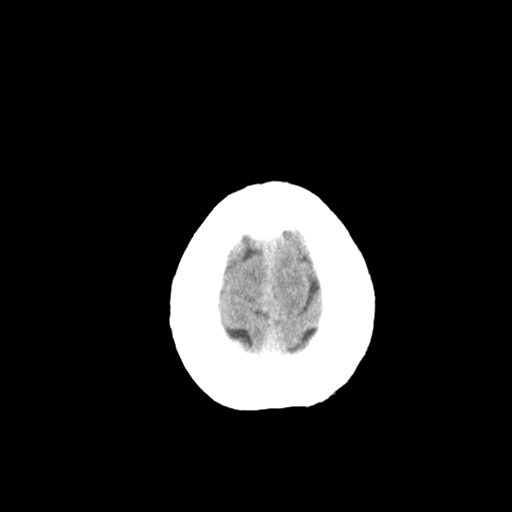

[Series 4: head 2.0 bone · axial · 0.45mm/px · z∈[-72,-56]mm · 2 of 77 slices shown]
[im 8/77  bone]
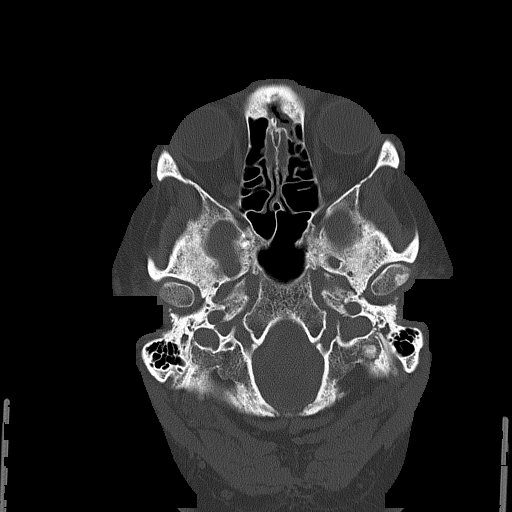
[im 16/77  bone]
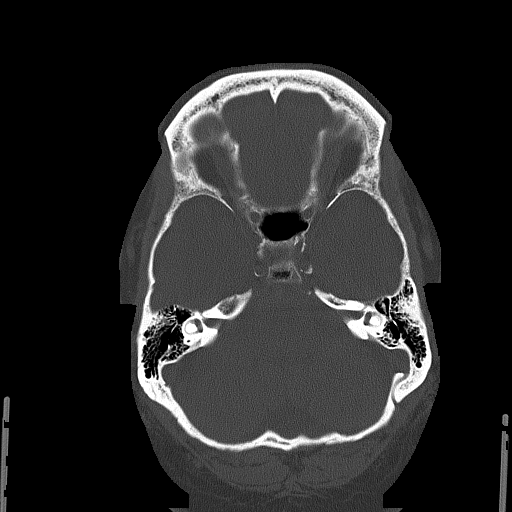

[Series 5: head 3.0 cor st · coronal · 0.33mm/px · 3 of 68 slices shown]
[im 23/68  brain]
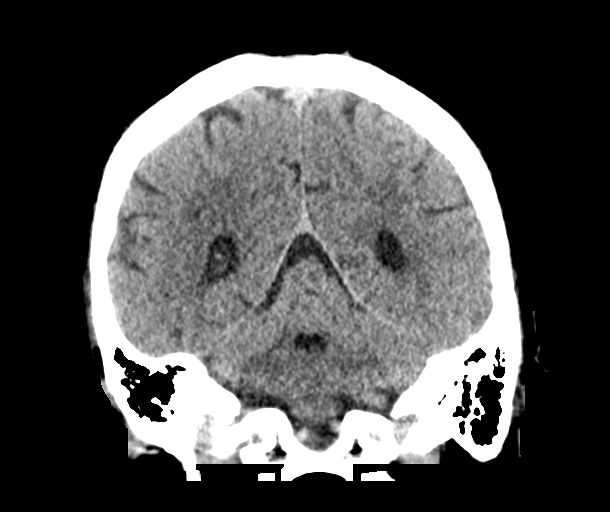
[im 30/68  brain]
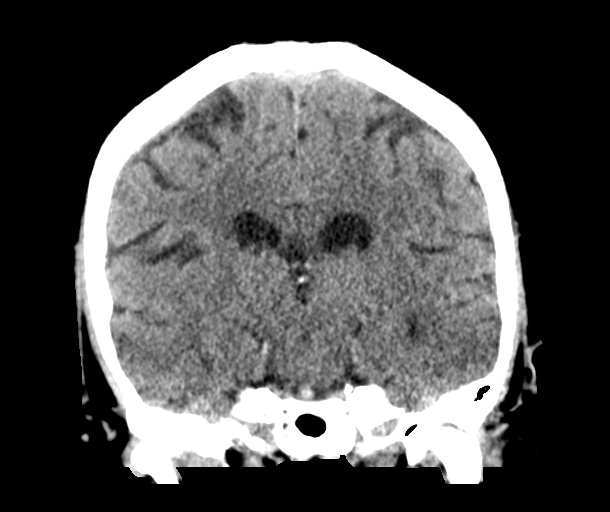
[im 38/68  brain]
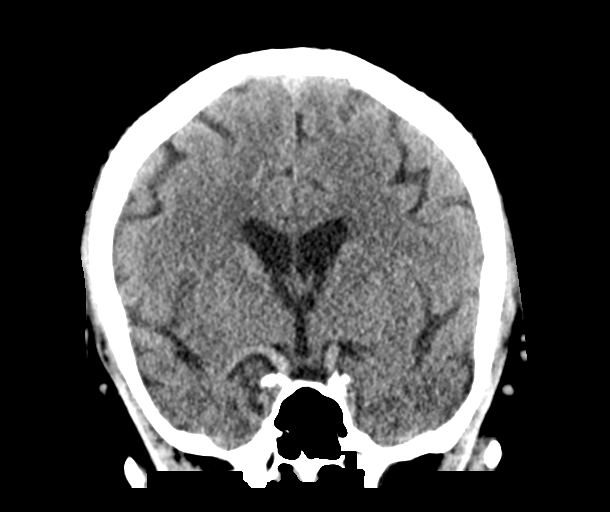

[Series 6: head 3.0 sag st · sagittal · 0.30mm/px · 3 of 53 slices shown]
[im 18/53  brain]
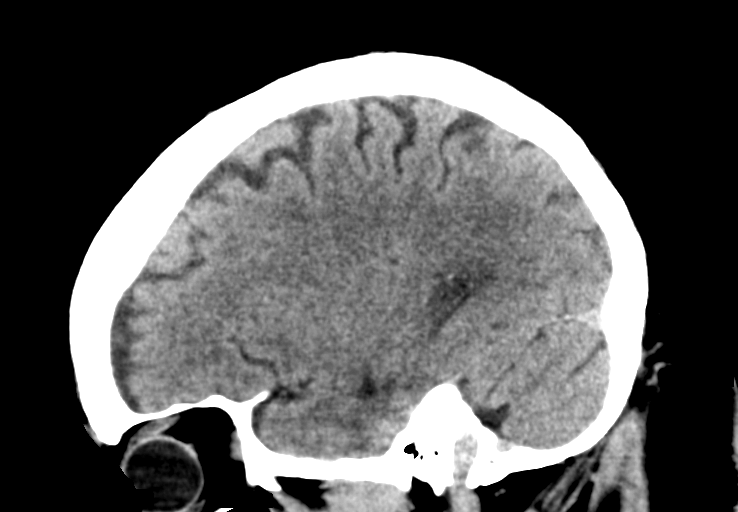
[im 27/53  brain]
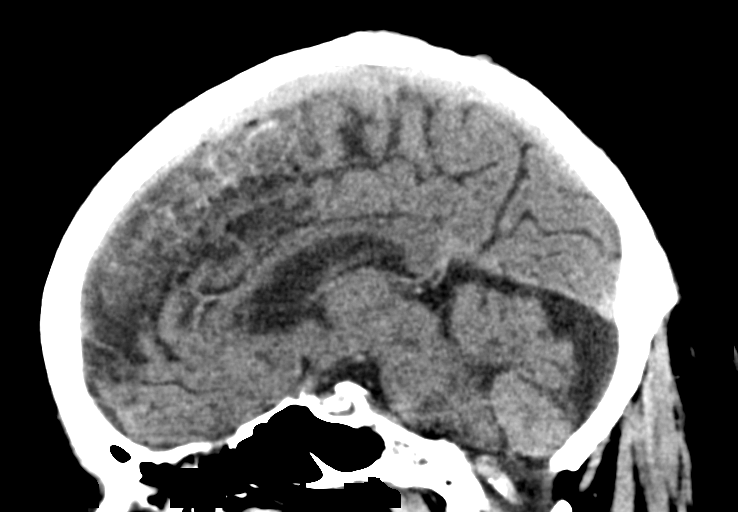
[im 35/53  brain]
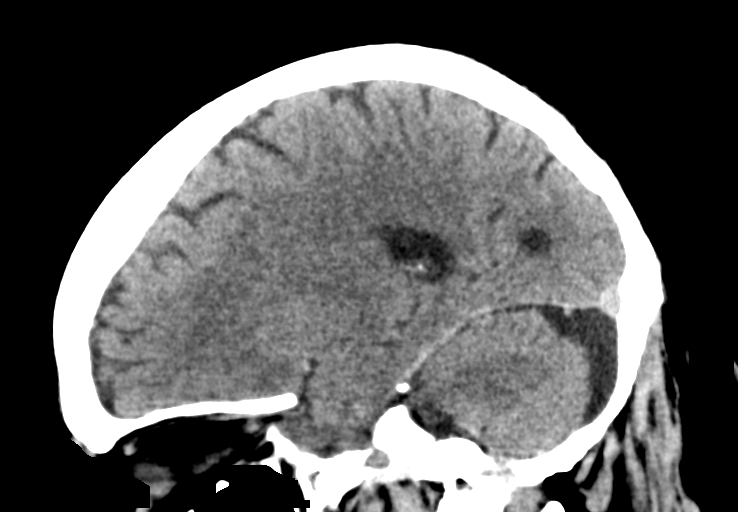

[15 of 47 positions shown; findings below may reference images not displayed]

FINDINGS: Brain:

Mild generalized cerebral atrophy.

Mild patchy and ill-defined hypoattenuation within the cerebral
white matter, nonspecific but compatible chronic small vessel
ischemic disease.

Retrocerebellar CSF density prominence at midline, which may reflect
a mega cisterna magna or posterior fossa arachnoid cyst.

There is no acute intracranial hemorrhage.

No demarcated cortical infarct.

No extra-axial fluid collection.

No evidence of an intracranial mass.

No midline shift.

Vascular: No hyperdense vessel.  Atherosclerotic calcifications.

Skull: Normal. Negative for fracture or focal lesion.

Sinuses/Orbits: Visualized orbits show no acute finding. Mild
mucosal thickening within the left frontal and right ethmoid
sinuses.

ASPECTS (Alberta Stroke Program Early CT Score)

- Ganglionic level infarction (caudate, lentiform nuclei, internal
capsule, insula, M1-M3 cortex): 7

- Supraganglionic infarction (M4-M6 cortex): 3

Total score (0-10 with 10 being normal): 10

These results were communicated to Dr. WITT At [DATE] WITT
[DATE]by text page via the AMION messaging system.
IMPRESSION: No evidence of acute intracranial abnormality.

Mild chronic small vessel ischemic changes within the cerebral white
matter.

Mild generalized cerebral atrophy.

## 2021-11-03 MED ORDER — LABETALOL HCL 5 MG/ML IV SOLN
INTRAVENOUS | Status: AC
  Administered 2021-11-03: 20 mg
  Filled 2021-11-03: qty 4

## 2021-11-03 MED ORDER — CO-ENZYME Q-10 30 MG PO CAPS: 100.0000 mg | ORAL_CAPSULE | Freq: Every day | ORAL | Status: DC

## 2021-11-03 MED ORDER — ACETAMINOPHEN 500 MG PO TABS
1000.0000 mg | ORAL_TABLET | Freq: Once | ORAL | Status: AC
  Administered 2021-11-03: 1000 mg via ORAL
  Filled 2021-11-03: qty 2

## 2021-11-03 MED ORDER — SENNOSIDES-DOCUSATE SODIUM 8.6-50 MG PO TABS: 1.0000 | ORAL_TABLET | Freq: Every evening | ORAL | Status: DC | PRN

## 2021-11-03 MED ORDER — STROKE: EARLY STAGES OF RECOVERY BOOK
Freq: Once | Status: AC
  Filled 2021-11-03: qty 1

## 2021-11-03 MED ORDER — LORAZEPAM 2 MG/ML IJ SOLN: 2.0000 mg | Freq: Once | INTRAMUSCULAR | Status: AC

## 2021-11-03 MED ORDER — INSULIN ASPART 100 UNIT/ML IJ SOLN
0.0000 [IU] | Freq: Three times a day (TID) | INTRAMUSCULAR | Status: DC
  Administered 2021-11-04: 7 [IU] via SUBCUTANEOUS
  Administered 2021-11-04 – 2021-11-05 (×4): 4 [IU] via SUBCUTANEOUS
  Administered 2021-11-05 – 2021-11-06 (×2): 7 [IU] via SUBCUTANEOUS
  Administered 2021-11-06: 4 [IU] via SUBCUTANEOUS

## 2021-11-03 MED ORDER — ASPIRIN 325 MG PO TABS: 325.0000 mg | ORAL_TABLET | Freq: Every day | ORAL | Status: DC

## 2021-11-03 MED ORDER — ACETAMINOPHEN 160 MG/5ML PO SOLN: 650.0000 mg | ORAL | Status: DC | PRN

## 2021-11-03 MED ORDER — LORAZEPAM 2 MG/ML IJ SOLN
INTRAMUSCULAR | Status: AC
  Administered 2021-11-03: 2 mg
  Filled 2021-11-03: qty 1

## 2021-11-03 MED ORDER — PANTOPRAZOLE SODIUM 40 MG IV SOLR
40.0000 mg | Freq: Every day | INTRAVENOUS | Status: DC
  Administered 2021-11-03: 40 mg via INTRAVENOUS
  Filled 2021-11-03: qty 10

## 2021-11-03 MED ORDER — ASPIRIN 325 MG PO TABS
ORAL_TABLET | ORAL | Status: AC
  Administered 2021-11-03: 325 mg
  Filled 2021-11-03: qty 1

## 2021-11-03 MED ORDER — SODIUM CHLORIDE 0.9% FLUSH: 3.0000 mL | Freq: Once | INTRAVENOUS | Status: DC

## 2021-11-03 MED ORDER — DEXMEDETOMIDINE HCL IN NACL 400 MCG/100ML IV SOLN
INTRAVENOUS | Status: AC
Start: 2021-11-03 — End: 2021-11-03
  Filled 2021-11-03: qty 100

## 2021-11-03 MED ORDER — CLEVIDIPINE BUTYRATE 0.5 MG/ML IV EMUL
INTRAVENOUS | Status: AC
  Administered 2021-11-03: 2 mg/h via INTRAVENOUS
  Filled 2021-11-03: qty 50

## 2021-11-03 MED ORDER — MELATONIN 3 MG PO TABS: 3.0000 mg | ORAL_TABLET | Freq: Every day | ORAL | Status: DC

## 2021-11-03 MED ORDER — LABETALOL HCL 5 MG/ML IV SOLN
10.0000 mg | Freq: Once | INTRAVENOUS | Status: DC
  Filled 2021-11-03: qty 4

## 2021-11-03 MED ORDER — CLEVIDIPINE BUTYRATE 0.5 MG/ML IV EMUL: 0.0000 mg/h | INTRAVENOUS | Status: DC

## 2021-11-03 MED ORDER — PROCHLORPERAZINE EDISYLATE 10 MG/2ML IJ SOLN
5.0000 mg | Freq: Once | INTRAMUSCULAR | Status: AC
  Administered 2021-11-03: 5 mg via INTRAVENOUS
  Filled 2021-11-03: qty 2

## 2021-11-03 MED ORDER — DEXMEDETOMIDINE HCL IN NACL 400 MCG/100ML IV SOLN
0.4000 ug/kg/h | INTRAVENOUS | Status: DC
  Administered 2021-11-03: 0.4 ug/kg/h via INTRAVENOUS
  Administered 2021-11-03: 1 ug/kg/h via INTRAVENOUS
  Administered 2021-11-03: 1.2 ug/kg/h via INTRAVENOUS
  Administered 2021-11-04: 1 ug/kg/h via INTRAVENOUS
  Administered 2021-11-04: 0.7 ug/kg/h via INTRAVENOUS
  Administered 2021-11-04: 1 ug/kg/h via INTRAVENOUS
  Filled 2021-11-03 (×2): qty 100
  Filled 2021-11-03: qty 300

## 2021-11-03 MED ORDER — IOHEXOL 350 MG/ML SOLN
75.0000 mL | Freq: Once | INTRAVENOUS | Status: AC | PRN
  Administered 2021-11-03: 75 mL via INTRAVENOUS

## 2021-11-03 MED ORDER — HALOPERIDOL LACTATE 5 MG/ML IJ SOLN
0.5000 mg | Freq: Four times a day (QID) | INTRAMUSCULAR | Status: DC | PRN
  Administered 2021-11-03: 0.5 mg via INTRAMUSCULAR
  Filled 2021-11-03: qty 1

## 2021-11-03 MED ORDER — ADULT MULTIVITAMIN W/MINERALS CH
1.0000 | ORAL_TABLET | Freq: Every day | ORAL | Status: DC
  Administered 2021-11-03 – 2021-11-06 (×4): 1 via ORAL
  Filled 2021-11-03 (×4): qty 1

## 2021-11-03 MED ORDER — ACETAMINOPHEN 325 MG PO TABS
650.0000 mg | ORAL_TABLET | ORAL | Status: DC | PRN
  Administered 2021-11-04 – 2021-11-06 (×6): 650 mg via ORAL
  Filled 2021-11-03 (×6): qty 2

## 2021-11-03 MED ORDER — DIPHENHYDRAMINE HCL 50 MG/ML IJ SOLN
25.0000 mg | Freq: Once | INTRAMUSCULAR | Status: AC
  Administered 2021-11-03: 25 mg via INTRAVENOUS
  Filled 2021-11-03: qty 1

## 2021-11-03 MED ORDER — DIPHENHYDRAMINE HCL 50 MG/ML IJ SOLN
25.0000 mg | Freq: Once | INTRAMUSCULAR | Status: AC
Start: 2021-11-03 — End: 2021-11-03
  Administered 2021-11-03: 25 mg via INTRAVENOUS
  Filled 2021-11-03: qty 1

## 2021-11-03 MED ORDER — ATORVASTATIN CALCIUM 10 MG PO TABS
20.0000 mg | ORAL_TABLET | Freq: Every day | ORAL | Status: DC
Start: 2021-11-03 — End: 2021-11-04
  Administered 2021-11-03 – 2021-11-04 (×2): 20 mg via ORAL
  Filled 2021-11-03 (×2): qty 2

## 2021-11-03 MED ORDER — TENECTEPLASE FOR STROKE
25.0000 mg | PACK | Freq: Once | INTRAVENOUS | Status: AC
  Administered 2021-11-03: 25 mg via INTRAVENOUS
  Filled 2021-11-03: qty 10

## 2021-11-03 MED ORDER — HALOPERIDOL 1 MG PO TABS
1.0000 mg | ORAL_TABLET | Freq: Four times a day (QID) | ORAL | Status: DC | PRN
  Filled 2021-11-03: qty 1

## 2021-11-03 MED ORDER — ACETAMINOPHEN 650 MG RE SUPP: 650.0000 mg | RECTAL | Status: DC | PRN

## 2021-11-03 MED ORDER — LORAZEPAM 2 MG/ML IJ SOLN
1.0000 mg | INTRAMUSCULAR | Status: DC | PRN
  Administered 2021-11-03: 1 mg via INTRAVENOUS
  Filled 2021-11-03: qty 1

## 2021-11-03 MED ORDER — CLEVIDIPINE BUTYRATE 0.5 MG/ML IV EMUL
0.0000 mg/h | INTRAVENOUS | Status: DC
  Administered 2021-11-03: 2 mg/h via INTRAVENOUS
  Administered 2021-11-03: 3 mg/h via INTRAVENOUS
  Filled 2021-11-03: qty 50

## 2021-11-03 NOTE — ED Notes (Signed)
On arrival to ICU patient has acute neuro change. On arrival patient unable to follow commands and very agitated. Pulling off monitor and attempting to pull out IVs. Prior to leaving ED patient was A&0x4, able to communicate fully with RN and even assisted with using urinal.  ?

## 2021-11-03 NOTE — Progress Notes (Signed)
PT Cancellation Note ? ?Patient Details ?Name: Jovann Luse ?MRN: [id number] ?DOB: 1956-01-05 ? ? ?Cancelled Treatment:    Reason Eval/Treat Not Completed: Medical issues which prohibited therapy.  Had a rapid response and headed to ICU.  Hold PT for now and re-attempt when pt is more medically stable. ? ? ?Ivar Drape ?11/03/2021, 1:17 PM ? ?Samul Dada, PT PhD ?Acute Rehab Dept. Number: [facility] [phone] and [facility] [phone] ? ?

## 2021-11-03 NOTE — Progress Notes (Signed)
EEG complete - results pending 

## 2021-11-03 NOTE — Procedures (Signed)
Routine EEG Report ? ?Franklin Calderon is a 66 y.o. male with a history of stroke who is undergoing an EEG to evaluate for seizures. ? ?Report: This EEG was acquired with electrodes placed according to the International 10-20 electrode system (including Fp1, Fp2, F3, F4, C3, C4, P3, P4, O1, O2, T3, T4, T5, T6, A1, A2, Fz, Cz, Pz). The following electrodes were missing or displaced: none. ? ?There was no clear waking rhythm. The majority of the initial recording exhibited a background in the delta range approximately 3 Hz with brief interludes of alpha range activity. This activity was reactive to stimulation. Towards the end of the recording more of the background activity was in the alpha range again approximately at 8 Hz. There were occasional sleep spindles noted. There was no focal slowing. There were no interictal epileptiform discharges. There were no electrographic seizures identified. Photic stimulation and hyperventilation were not performed.  ? ?Impression and clinical correlation: This EEG was obtained while sedated on precedex and is abnormal due to intermittent moderate to severe diffuse slowing indicative of global cerebral dysfunction indicative of global cerebral dysfunction, medication effect, or both. Epileptiform abnormalities were not seen during this recording. ? ?Bing Neighbors, MD ?[facility] ?[phone] ? ?If 7pm- 7am, please page neurology on call as listed in AMION. ? ?

## 2021-11-03 NOTE — Progress Notes (Signed)
History of Present Illness: Franklin Calderon is a functionally independent 66 y.o. male with a pertinent PMH of T2DM, HTN, CAD s/p CABG (2016), who presents to [facility] with right hand numbness.  The history was provided by the patient and his spouse at bedside. ? ?Patient states that he was in his usual state of health until earlier this morning at 9 AM when he developed acute onset right hand numbness and weakness with associated right facial droop and word finding difficulties. EMS was called and his weakness and facial droop resolved in route to the hospital.  ? ?In the ED, neurology was consulted for a code stroke.  Patient was deemed to have an NIH score of 1 and therefore, TNK was withheld.  CT head showed mild chronic small vessel ischemia with some generalized cerebral atrophy. CTA of his head showed moderate stenosis of the left ICA and severe stenosis of the mid M2 right MCA and proximal M2 left MCA. MRI of brain is pending .  ? ?When I evaluated the patient, he had worsening of his word finding aphasia with new difficulty with speech comprehension.  This was especially notable during physical exam when patient was unable to fully comprehend many of my questions and therefore unable to perform or performed incorrectly many of the physical exam maneuvers. ? ?Based on these new findings, I quickly called the ED provider who evaluated the patient previously who agreed that this is a progression of his underlying neurologic deficits.  I subsequently reached out to the neurology team and called a code stroke.  I remained with the patient at bedside until neurology was present to evaluate the patient.  Neurology team reassessed him and deemed him appropriate for TNK.  As a result, patient will be kept on the neurology service. ? ?Signature: ?Chari Manning, D.O.  ?Internal Medicine Resident, PGY-3 ?[facility]  ?Pager #: [phone] (If no response, contact on-call pager) ?11:21 AM,  11/03/2021  ? ?Please contact the on-call pager after 5 pm and on weekends at [phone]. ? ?

## 2021-11-03 NOTE — Consult Note (Addendum)
NEUROLOGY CONSULTATION NOTE   Date of service: November 03, 2021 Patient Name: Franklin Calderon MRN:  [id number] DOB:  Oct 13, 1955 Reason for consult: "Aphasia and R sided weakness" Requesting Provider: Lajean Saver, MD _ _ _   _ __   _ __ _ _  __ __   _ __   __ _  History of Present Illness  Franklin Calderon is a 66 y.o. male with PMH significant for DM2, HTN, HLD, CAD s/p CABG in 2016 and post op CABG.  HE woke up this AM around 0840 and felt fine. Did not speak to anyone but was walking and talking. Around 0905, he had sudden onset R hand numbness and clumsiness. He came downstairs and spoke to his wife and had some trouble with finding words and a R facial droop.  EMS called. Weakness resolved enroute, facial droop resolved. He was still having a little bit of trouble with finding the right words.  On arrival, he is very fluent but with some rare words that he get stuck on. He reports this is a lot improved from when it started. NIHSS of 1.  LKW: [id number] 11/03/2021 mRS: 0 tNKASE: Please see the update below Thrombectomy: not offered, no LVO.  Update and tNKASE administration: tNKASE was not offered initially given he had very mild aphasia on arrival. I was called by IM Resident Dr. Marianna Payment around noon about worsening of aphasia. On my evaluation, patient now having trouble with naming some objects. However he is making several errors specially when is attempting to talk fluently, its mostly word salad. This is a significantly disabling deficit and me and patient and his wife had earlier discussed about risks and benefits of tNKASE. I discussed tNKASE with patient and his wife again and they opted for tNKASE. Some delay as we had to lower his blood pressure prior to administering tNKASE.  Updated NIHSS prior to administration of tNKASE: NIHSS components Score: Comment  1a Level of Conscious 0[x]  1[]  2[]  3[]      1b LOC Questions 0[]  1[x]  2[]       1c LOC Commands 0[x]  1[]  2[]       2 Best Gaze 0[x]  1[]   2[]       3 Visual 0[x]  1[]  2[]  3[]      4 Facial Palsy 0[x]  1[]  2[]  3[]      5a Motor Arm - left 0[x]  1[]  2[]  3[]  4[]  UN[]    5b Motor Arm - Right 0[x]  1[]  2[]  3[]  4[]  UN[]    6a Motor Leg - Left 0[x]  1[]  2[]  3[]  4[]  UN[]    6b Motor Leg - Right 0[x]  1[]  2[]  3[]  4[]  UN[]    7 Limb Ataxia 0[x]  1[]  2[]  3[]  UN[]     8 Sensory 0[x]  1[]  2[]  UN[]      9 Best Language 0[]  1[]  2[x]  3[]      10 Dysarthria 0[x]  1[]  2[]  UN[]      11 Extinct. and Inattention 0[x]  1[]  2[]       TOTAL: 3     ROS   Constitutional Denies weight loss, fever and chills.   HEENT Denies changes in vision and hearing.   Respiratory Denies SOB and cough.   CV Denies palpitations and CP   GI Denies abdominal pain, nausea, vomiting and diarrhea.   GU Denies dysuria and urinary frequency.   MSK Denies myalgia and joint pain.   Skin Denies rash and pruritus.   Neurological Denies headache and syncope.   Psychiatric Denies recent changes in mood. Denies anxiety and depression.  Past History   Past Medical History:  Diagnosis Date   Coronary artery disease    Diabetes mellitus without complication ([place])    Hyperlipidemia    Hypertension    Past Surgical History:  Procedure Laterality Date   AORTIC VALVE REPLACEMENT  02/10/2015   CORONARY ARTERY BYPASS GRAFT     No family history on file. Social History   Socioeconomic History   Marital status: Married    Spouse name: Not on file   Number of children: Not on file   Years of education: Not on file   Highest education level: Not on file  Occupational History   Not on file  Tobacco Use   Smoking status: Never   Smokeless tobacco: Not on file  Substance and Sexual Activity   Alcohol use: No   Drug use: Not on file   Sexual activity: Not on file  Other Topics Concern   Not on file  Social History Narrative   Not on file   Social Determinants of Health   Financial Resource Strain: Not on file  Food Insecurity: Not on file  Transportation Needs: Not on file   Physical Activity: Not on file  Stress: Not on file  Social Connections: Not on file   No Known Allergies  Medications  (Not in a hospital admission)    Vitals   Vitals:   11/03/21 1000  Weight: 110.4 kg     Body mass index is 36.47 kg/m.  Physical Exam   General: Laying comfortably in bed; in no acute distress.  HENT: Normal oropharynx and mucosa. Normal external appearance of ears and nose.  Neck: Supple, no pain or tenderness  CV: No JVD. No peripheral edema.  Pulmonary: Symmetric Chest rise. Normal respiratory effort.  Abdomen: Soft to touch, non-tender.  Ext: No cyanosis, edema, or deformity  Skin: No rash. Normal palpation of skin.   Musculoskeletal: Normal digits and nails by inspection. No clubbing.   Neurologic Examination  Mental status/Cognition: Alert, oriented to self, place, month and year, good attention.  Speech/language: Fluent, comprehension intact, object naming intact(except for that he got stuck on not being able to identify the sink on aphasia pics), repetition intact.  Cranial nerves:   CN II Pupils equal and reactive to light, no VF deficits    CN III,IV,VI EOM intact, no gaze preference or deviation, no nystagmus    CN V normal sensation in V1, V2, and V3 segments bilaterally    CN VII no asymmetry, no nasolabial fold flattening    CN VIII normal hearing to speech    CN IX & X normal palatal elevation, no uvular deviation    CN XI 5/5 head turn and 5/5 shoulder shrug bilaterally    CN XII midline tongue protrusion    Motor:  Muscle bulk: normal, tone normal, pronator drift none tremor none Mvmt Root Nerve  Muscle Right Left Comments  SA C5/6 Ax Deltoid 5 5   EF C5/6 Mc Biceps 5 5   EE C6/7/8 Rad Triceps 5 5   WF C6/7 Med FCR     WE C7/8 PIN ECU     F Ab C8/T1 U ADM/FDI 5 5   HF L1/2/3 Fem Illopsoas 5 5   KE L2/3/4 Fem Quad 5 5   DF L4/5 D Peron Tib Ant 5 5   PF S1/2 Tibial Grc/Sol 5 5    Reflexes:  Right Left Comments  Pectoralis       Biceps (C5/6) 2 2  Brachioradialis (C5/6) 2 2    Triceps (C6/7) 2 2    Patellar (L3/4) 2 2    Achilles (S1)      Hoffman      Plantar     Jaw jerk    Sensation:  Light touch Intact throughout   Pin prick    Temperature    Vibration   Proprioception    Coordination/Complex Motor:  - Finger to Nose intact BL - Heel to shin intact BL - Rapid alternating movement intact BL - Gait: Deferred.  Labs   CBC:  Recent Labs  Lab 11/03/21 1017  HGB 16.7  HCT 99991111    Basic Metabolic Panel:  Lab Results  Component Value Date   NA 133 (L) 11/03/2021   K 4.5 11/03/2021   GLUCOSE 106 (H) 11/03/2021   BUN 15 11/03/2021   CREATININE 0.90 11/03/2021   Lipid Panel: No results found for: LDLCALC HgbA1c: No results found for: HGBA1C Urine Drug Screen: No results found for: LABOPIA, COCAINSCRNUR, LABBENZ, AMPHETMU, THCU, LABBARB  Alcohol Level No results found for: [place]  CT Head without contrast(Personally reviewed): CTH was negative for a large hypodensity concerning for a large territory infarct or hyperdensity concerning for an [place]  CT angio Head and Neck with contrast(Personally reviewed): No LVO on my read.  MRI Brain: pending   Impression   Kierian Hoggan is a 66 y.o. male with PMH significant for DM2, HTN, HLD, CAD s/p CABG in 2016 and post op CABG. He presents with R hand clumsiness, R facial droop + aphasia. His R arm clumsiness and R facial droop has resolved. He had very mild aphasia with no errors when naming objects, fluent speech but when attempting to describes aphasia picture for NIHSS, he was unable to recall the name for sink.  His symptoms are too mild and therefore he was not offered TNKase. Not a candidate for thrombectomy due to no LVO.  He had worsening of his symptoms around noon and I was notified. On my evaluation, patient now having trouble with naming some objects. However he is making several errors specially when is attempting to talk  fluently, and its mostly word salad when he attempts to talk. This is a significantly disabling deficit and me and patient and his wife had earlier discussed about risks and benefits of tNKASE. I discussed tNKASE with patient and his wife again and they opted for tNKASE. Some delay as we had to lower his blood pressure prior to administering tNKASE.  Primary Diagnosis:  Cerebral infarction, unspecified.  Secondary Diagnosis: Essential (primary) hypertension, Type 2 diabetes mellitus with hyperglycemia , and Obesity  Recommendations   Stroke determined by clinical criteria and s/p tNKASE - Frequent NeuroChecks for post tNK care in the ICU - Initial CTH demonstrated no acute hemorrhage or mass - MRI Brain - pending - CTA - multifocal multivessel stenosis specifically L cavernous ICA moderate stenosis and left MCA M2 severe stenosis. - TTE - pending - Lipid Panel: LDL - pending  - Statin: continue home atorvastatin for now. - HbA1c: pending - Antithrombotic: Start ASA 81 mg daily if 24 h CTH does not show acute hemorrhage - DVT prophylaxis: SCDs. Pharmacologic prophylaxis if 24 h CTH does not demonstrate acute hemorrhage - Systolic Blood Pressure goal: < 180 mm Hg - Telemetry monitoring for arrhythmia: 72 hours - Swallow screen - ordered - PT/OT/SLP consults   HTN: - hold home meds. Will use Cleviprex and labetalol PRN and SBP goal as above.  HLD: - continue home atorvasatatin. LDL is pending.  DM2: - Carb modified diet with sliding scale insulin   _____________________________________________________________________  Plan discused with patient, with patient's wife at bedside and with the ED team.  This patient is critically ill and at significant risk of neurological worsening, death and care requires constant monitoring of vital signs, hemodynamics,respiratory and cardiac monitoring, neurological assessment, discussion with family, other specialists and medical decision making  of high complexity. I spent 90 minutes of neurocritical care time  in the care of  this patient. This was time spent independent of any time provided by nurse practitioner or PA.  Donnetta Simpers [facility] Pager Number [phone] 11/03/2021  11:27 AM   Thank you for the opportunity to take part in the care of this patient. If you have any further questions, please contact the neurology consultation attending.  Signed,  [place] Pager Number [phone] _ _ _   _ __   _ __ _ _  __ __   _ __   __ _

## 2021-11-03 NOTE — Progress Notes (Signed)
On exam patient is somnolent and unable to arouse. He occasionally will wake and become aggitated and thrash in the bed, but does not respond to any verbal commands. Precedex was titrated down some to attempt an exam.  ? ?He shows no new weakness or other deficits on exam. His pupils @ 1930 were 4 equal and brisk. At 2015 they were 2's equal and brisk. Now his pupils are back to 4's and brisk. Reached out to Dr. Theda Sers about varying pupils sizes and his somnolence. No new orders at this time, Was informed to obtain a [place] if patient begins to experience signs of weakness.  ?

## 2021-11-03 NOTE — Progress Notes (Signed)
He developed agitation down in the ED which was significantly worse on arrival to the ICU. A STAT CTH was obtained which was negative for hemorrhage. ? ?On my evaluation, he is thrashing in the bed. Tachycardic and hypertensive. He had to be put in restraints and was not redirectable. Agitation persisted despite Ativan, Benadryl, Haldol. He had to be started on Precedex gtt for agitation. ? ?A STAT EEG was also obtained which was negative for any epileptiform discharge or seizures. ? ?I confirmed with his wife and he does not drink alcohol, does not use any recreational substances. He has never had any paradoxical reaction to medications. He does not seem to be in pain. Wife reports that he probably has undiagnosed panic attacks. ? ?Artery of percheron stroke or sometimes basal ganglia strokes can cause agitation too. ? ?Etiology is somewhat unclear at this time. Will continue to monitor in the ICU. ? ?I spoke with wife and daughter at the bedside to update them. ? ? ?Franklin Calderon ?[facility] ?Pager Number [phone] ? ?

## 2021-11-03 NOTE — ED Triage Notes (Signed)
Per [facility] pt activated as code stroke with LKN 9:05 this am. States when pt went to eat breakfast at that time wife noticed right facial droop, slurred speech and aphasia. EMS reports on arrival symptoms improved except for aphasia. Patient A&0x4 just having mild aphasia. C/o headaches over the past few days. States he has been out of his medications for weeks including his diabetes and blood pressure meds.  ?

## 2021-11-03 NOTE — Code Documentation (Addendum)
Stroke Response Nurse Documentation ?Code Documentation ? ?Franklin Calderon is a 66 y.o. male arriving to [facility]  via [facility] on 11/03/2021 with past medical hx of AVR & CABG, AF, HTN. On No antithrombotic. Code stroke was activated by EMS.  ? ?Patient from home where he was LKW at [phone] and now complaining of right facial droop and aphasia.  He states he was his normal self when he awoke this am about 0905 his wife noted facial droop and difficulty finding his words.  ? ?Stroke team at the bedside on patient arrival. Labs drawn and patient cleared for CT by Dr [name]. Patient to CT with team. NIHSS 1, see documentation for details and code stroke times. Patient with Expressive aphasia  on exam. The following imaging was completed:  CT Head and CTA. Patient is not a candidate for IV Thrombolytic due to mild symptoms.  . Patient is not not a candidate for IR due to no LVO.  ? ?Care Plan: mNIHSS and VS @ 84min until 1335. RN to notify MD of worsening symptoms.   ? ?Bedside handoff with ED RN Joellen Jersey.   ? ?1213 patient with worsening aphasia,  Dr Sal at bedside to reassess patient.  Decision made to give TNK. ?BP196/79:  Labetalol given IV, cleviprex gtt started ?1217 TNK given IV ? ? ?Raliegh Ip  ?Stroke Response RN ?  ?

## 2021-11-03 NOTE — H&P (Signed)
Please see the consult note by me from earlier today for H&P. ? ?Erick Blinks ?[facility] ?Pager Number [phone] ?

## 2021-11-03 NOTE — Progress Notes (Signed)
PHARMACIST CODE STROKE RESPONSE ? ?Notified to mix TNK at 1212 by Dr. Derry Lory ?Delivered TNK to RN at 1215 ? ?TNK dose = 25 mg IV over 5 seconds.  ? ?Issues/delays encountered (if applicable): Called back to room for worsening symptoms and decision to treat after initial code stroke response, SBP management requiring administration of 20mg  labetalol before TNK administration at 1217 ? ? ?11/03/21 12:23 PM ?

## 2021-11-03 NOTE — ED Notes (Signed)
Patient concerned for worsening speech problems and right hand numbness. Neurologist at bedside assessing patient.  ?

## 2021-11-03 NOTE — ED Provider Notes (Signed)
?[facility] EMERGENCY DEPARTMENT ?Provider Note ? ?History  ? ?Chief Complaint  ?Patient presents with  ? Aphasia  ? ?Franklin Calderon is a 66 y.o. male w/ h/o CAD s/p CABG, s/p aortic valve replacement, T2DM, HTN, HLD, GERD who p/w c/f stroke.  ? ?The history is provided by the patient, the EMS personnel and the spouse.  ?Illness ?Location:  Aphasia ?Quality:  Reported difficulty speaking ?Severity:  Moderate ?Onset quality:  Sudden ?Duration: Ocurred at 0905 on 3/4. ?Timing:  Constant ?Progression:  Partially resolved ?Chronicity:  New ?Context:  See MDM ?Associated symptoms: headaches   ?Associated symptoms: no abdominal pain, no chest pain, no congestion, no cough, no diarrhea, no fever, no nausea, no rash, no shortness of breath and no sore throat   ? ? ?Past Medical History:  ?Diagnosis Date  ? Coronary artery disease   ? Diabetes mellitus without complication (HCC)   ? Hyperlipidemia   ? Hypertension   ? ? ?Social History  ? ?Tobacco Use  ? Smoking status: Never  ?Substance Use Topics  ? Alcohol use: No  ?  ? ?No family history on file. ? ?Review of Systems  ?Constitutional:  Negative for chills and fever.  ?HENT:  Negative for congestion and sore throat.   ?Eyes:  Negative for photophobia and visual disturbance.  ?Respiratory:  Negative for cough and shortness of breath.   ?Cardiovascular:  Negative for chest pain and palpitations.  ?Gastrointestinal:  Negative for abdominal pain, blood in stool, diarrhea and nausea.  ?Endocrine: Negative.   ?Genitourinary:  Negative for dysuria and hematuria.  ?Musculoskeletal:  Negative for neck pain and neck stiffness.  ?Skin:  Negative for rash and wound.  ?Allergic/Immunologic: Negative.   ?Neurological:  Positive for facial asymmetry (PTA, resolved now), speech difficulty and headaches. Negative for dizziness, seizures, syncope and light-headedness.  ?Hematological: Negative.   ?Psychiatric/Behavioral: Negative.    ? ? ?Physical Exam  ?BP: 188/98 ?HR  81 ?SPO2 98% ?RR 15 ?99.2 F ? ?Physical Exam ?Vitals and nursing note reviewed.  ?Constitutional:   ?   General: He is not in acute distress. ?   Appearance: He is well-developed. He is obese. He is not ill-appearing or diaphoretic.  ?   Comments: Smiling, pleasant on exam, apologizing for some confusion / word finding difficulty  ?HENT:  ?   Head: Normocephalic and atraumatic.  ?   Nose: Nose normal. No congestion.  ?   Mouth/Throat:  ?   Mouth: Mucous membranes are moist.  ?   Pharynx: Oropharynx is clear. No oropharyngeal exudate.  ?Eyes:  ?   Extraocular Movements: Extraocular movements intact.  ?   Conjunctiva/sclera: Conjunctivae normal.  ?   Pupils: Pupils are equal, round, and reactive to light.  ?Cardiovascular:  ?   Rate and Rhythm: Normal rate and regular rhythm.  ?   Pulses: Normal pulses.  ?   Heart sounds: Normal heart sounds. No murmur heard. ?Pulmonary:  ?   Effort: Pulmonary effort is normal. No respiratory distress.  ?   Breath sounds: Normal breath sounds.  ?Abdominal:  ?   Palpations: Abdomen is soft.  ?   Tenderness: There is no abdominal tenderness. There is no right CVA tenderness, left CVA tenderness, guarding or rebound.  ?   Hernia: No hernia is present.  ?Musculoskeletal:     ?   General: No swelling. Normal range of motion.  ?   Cervical back: Normal range of motion and neck supple.  ?  Right lower leg: No edema.  ?   Left lower leg: No edema.  ?Skin: ?   General: Skin is warm and dry.  ?   Capillary Refill: Capillary refill takes less than 2 seconds.  ?   Findings: No rash.  ?Neurological:  ?   Mental Status: He is alert and oriented to person, place, and time.  ?   Sensory: No sensory deficit.  ?   Motor: No weakness.  ?   Coordination: Coordination normal.  ?   Comments:  ?CN intact ?Mild aphasia  ?Psychiatric:     ?   Mood and Affect: Mood normal.     ?   Behavior: Behavior normal.  ? ? ?ED Course  ?Procedures ? ?Medical Decision Making:  ?Franklin Calderon is a 66 y.o. male w/ h/o CAD  s/p CABG, s/p aortic valve replacement, T2DM, HTN, HLD, GERD who p/w mild aphasia.  ? ?HA x weeks, intermittent, felt like normal headaches ?Awoke in normal state of health ?Last known normal: 0905 on 3/4 ?Subsequently developed numbness in RUE, RLE and "difficulty speaking", right facial droop ?Wife called EMS, EMS transported to the ED ?Prior to arrival, right facial droop, RUE/RLE numbness resolved.  Now remains only with mild aphasia. ?Patient states he has been out of some of his medications for the last month (amlodipine, olmesartan, metformin, atorvastatin) ?The patient was protecting/maintaining their own airway. ?The stroke team was emergently consulted. A thorough neuro exam was performed, as above.  ?Neurology has reviewed CT imaging and examined the patient at this time, given his mild aphasia, would not administer TNK at this time disease given the risk of bleed is higher than benefit. HOWEVER, neurology states that if his exam changes then they recommend re-code-stroking him as they may consider TNK at that time.  Patient was hypertensive 188/98 on my examination, neurology recommends permissive hypertension at this time. ? ?Neurology recommended admission to medicine for TIA work-up. ? ?ER provider interpretation of Imaging / Radiology:  ?CT head without contrast: No acute intracranial abnormality ?CT angio head/neck: Intracranial large vessel occlusion.  Moderate stenosis in the left ICA, severe stenosis mid M2 right MCA, severe stenosis proximal M2 left MCA. ? ?ER provider interpretation of EKG:  ?Artifact appreciated in multiple leads, no ST elevation or subacute changes appreciable ? ?ER provider interpretation of Labs:  ?CBC: No leukocytosis or acute anemia ?CMP: No emergent electrolyte abnormality, no AKI, no elevated LFTs ?Coags: Unremarkable ?FSBS: 109 ? ?Key medications administered in the ER:  ?Medications  ?sodium chloride flush (NS) 0.9 % injection 3 mL (3 mLs Intravenous Not Given 11/03/21  1051)  ?prochlorperazine (COMPAZINE) injection 5 mg (has no administration in time range)  ?aspirin 325 MG tablet (has no administration in time range)  ?aspirin tablet 325 mg (has no administration in time range)  ?iohexol (OMNIPAQUE) 350 MG/ML injection 75 mL (75 mLs Intravenous Contrast Given 11/03/21 1030)  ?acetaminophen (TYLENOL) tablet 1,000 mg (1,000 mg Oral Given 11/03/21 1104)  ? ? ?Diagnoses considered:  ?Etiology likely TIA, admit for workup.  ? ?Stroke mimics:  ?Unlikely seizures / postictal paralysis (no sz-like activity preceeding, no h/o sz) ?Unlikely syncope (as has persistent  or associated neuro sxs) ?Unlikely meningitis / encephalitis (no fever, no meningismus) ?Unlikely complicated migraine (no h/o similar episodes, no preceding aura) ?Unlikely brain neoplasm/abscess (no signs of infection, not c/w CTH) ?Unlikely epidural / subdural hematoma (no h/o trauma, no AC use, not c/w CTH) ?Unlikely SAH (no sudden onset worst HA of life) ?  Unlikely hypoglycemia (FSBS 111) ?Unlikely hyponatremia (no h/o diuretic use, no excessive free water intake, Na 133) ?Unlikely HTN encephalopathy (no global cerebral dysfunction, not gradual onset, BP 188/98) ?Unlikely Wernicke's encephalopathy (no h/o alcoholism or malnutrition, no triad of ataxia / ophthalmoplegia / confusion) ?Unlikely labyrinthitis (not predominately vestibular symptoms, has other focal findings) ?Unlikely Bell's palsy (no isolated peripheral 7th nerve palsy) ?Unlikely Meniere's disease (no h/o recurrent episodes dominated by vertigo sxs, tinnitus, deafness) ?Unlikely demyelinating disease such as MS (no gradual onset, no h/o multiple episodes of neuro findings in multifocal anatomic distributions) ? ?Consulted: Neuro Stroke team ? ?Patient seen in conjunction with Dr. Denton Lank ? ?Dragon medical dictation software was used in the creation of this note.  ? ?Electronically signed by: Drake Leach, MD on 11/03/2021 at 10:29 AM ? ?Clinical Impression:  ?1.  Dysarthria   ?2. Aphasia   ? ? ?Dispo: Admit ? ?  ?Drake Leach, MD ?11/05/21 1740 ? ?  ?Cathren Laine, MD ?11/06/21 1443 ? ?

## 2021-11-04 ENCOUNTER — Inpatient Hospital Stay (HOSPITAL_COMMUNITY): Payer: BC Managed Care – PPO

## 2021-11-04 DIAGNOSIS — I6389 Other cerebral infarction: Secondary | ICD-10-CM

## 2021-11-04 DIAGNOSIS — F419 Anxiety disorder, unspecified: Secondary | ICD-10-CM

## 2021-11-04 DIAGNOSIS — I951 Orthostatic hypotension: Secondary | ICD-10-CM

## 2021-11-04 DIAGNOSIS — G459 Transient cerebral ischemic attack, unspecified: Principal | ICD-10-CM

## 2021-11-04 DIAGNOSIS — E782 Mixed hyperlipidemia: Secondary | ICD-10-CM

## 2021-11-04 DIAGNOSIS — E1159 Type 2 diabetes mellitus with other circulatory complications: Secondary | ICD-10-CM

## 2021-11-04 DIAGNOSIS — I161 Hypertensive emergency: Secondary | ICD-10-CM

## 2021-11-04 LAB — LIPID PANEL
Cholesterol: 241 mg/dL — ABNORMAL HIGH (ref 0–200)
HDL: 50 mg/dL (ref >40)
LDL Cholesterol: 182 mg/dL — ABNORMAL HIGH (ref 0–99)
Total CHOL/HDL Ratio: 4.8 RATIO
Triglycerides: 46 mg/dL (ref <150)
VLDL: 9 mg/dL (ref 0–40)

## 2021-11-04 LAB — GLUCOSE, CAPILLARY
Glucose-Capillary: 203 mg/dL — ABNORMAL HIGH (ref 70–99)
Glucose-Capillary: 162 mg/dL — ABNORMAL HIGH (ref 70–99)
Glucose-Capillary: 196 mg/dL — ABNORMAL HIGH (ref 70–99)
Glucose-Capillary: 163 mg/dL — ABNORMAL HIGH (ref 70–99)

## 2021-11-04 LAB — BASIC METABOLIC PANEL
Anion gap: 9 (ref 5–15)
BUN: 18 mg/dL (ref 8–23)
CO2: 24 mmol/L (ref 22–32)
Calcium: 9.1 mg/dL (ref 8.9–10.3)
Chloride: 97 mmol/L — ABNORMAL LOW (ref 98–111)
Creatinine, Ser: 1.01 mg/dL (ref 0.61–1.24)
GFR, Estimated: >60 mL/min (ref >60)
Glucose, Bld: 173 mg/dL — ABNORMAL HIGH (ref 70–99)
Potassium: 4.2 mmol/L (ref 3.5–5.1)
Sodium: 130 mmol/L — ABNORMAL LOW (ref 135–145)

## 2021-11-04 LAB — ECHOCARDIOGRAM COMPLETE
AR max vel: 4.3 cm2
AV Area VTI: 4.73 cm2
AV Area mean vel: 4.17 cm2
AV Mean grad: 9 mmHg
AV Peak grad: 16.8 mmHg
Ao pk vel: 2.05 m/s
Area-P 1/2: 2.23 cm2
Height: 68 in
MV VTI: 6.43 cm2
S' Lateral: 2.8 cm
Weight: 3894.21 oz

## 2021-11-04 LAB — HEMOGLOBIN A1C
Hgb A1c MFr Bld: 6.4 % — ABNORMAL HIGH (ref 4.8–5.6)
Mean Plasma Glucose: 136.98 mg/dL

## 2021-11-04 LAB — CBC
HCT: 44.6 % (ref 39.0–52.0)
Hemoglobin: 15.2 g/dL (ref 13.0–17.0)
MCH: 31.5 pg (ref 26.0–34.0)
MCHC: 34.1 g/dL (ref 30.0–36.0)
MCV: 92.3 fL (ref 80.0–100.0)
Platelets: 227 10*3/uL (ref 150–400)
RBC: 4.83 MIL/uL (ref 4.22–5.81)
RDW: 13.2 % (ref 11.5–15.5)
WBC: 13.9 10*3/uL — ABNORMAL HIGH (ref 4.0–10.5)
nRBC: 0 % (ref 0.0–0.2)

## 2021-11-04 LAB — HIV ANTIBODY (ROUTINE TESTING W REFLEX): HIV Screen 4th Generation wRfx: NONREACTIVE

## 2021-11-04 IMAGING — MR MR HEAD W/O CM
4 of 10 series · 18 of 48 positions shown · non-contrast
Comparison: CT head, CTA head and neck yesterday.

CLINICAL DATA: 65-year-old male code stroke presentation yesterday.

EXAM:
MRI HEAD WITHOUT CONTRAST
MRA HEAD WITHOUT CONTRAST
TECHNIQUE: Multiplanar, multi-echo pulse sequences of the brain and surrounding
structures were acquired without intravenous contrast. Angiographic
images of the Circle of Willis were acquired using MRA technique
without intravenous contrast.

[Series 2: DWI · axial · 3.0mm · 0.94mm/px · z∈[-154,+5]mm · 9 of 112 slices shown (1 of 2)]
[im 1/112]
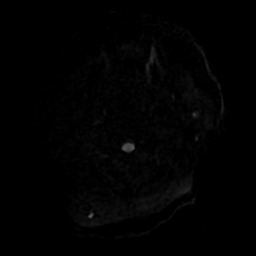
[im 21/112]
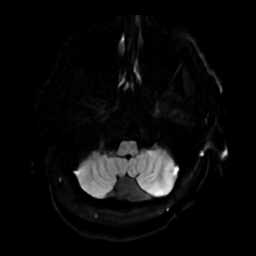
[im 31/112]
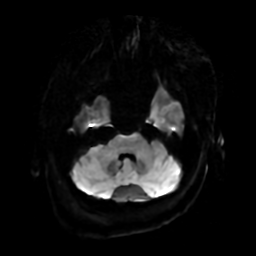
[im 51/112]
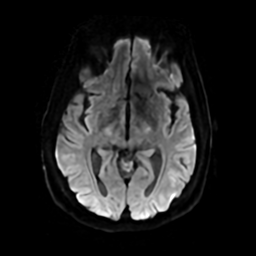
[im 61/112]
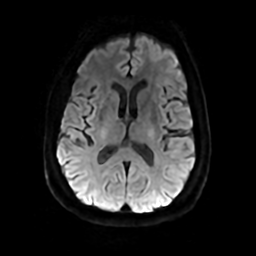
[im 81/112]
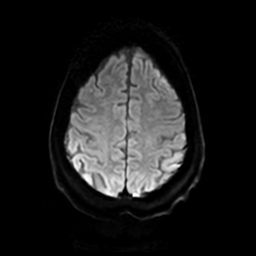
[im 91/112]
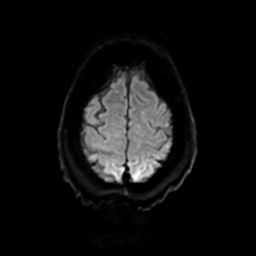
[im 101/112]
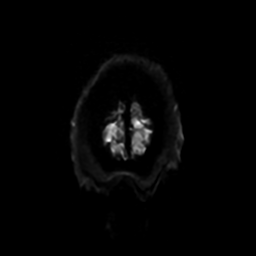
[im 112/112]
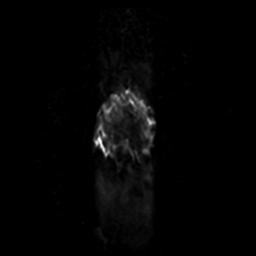

[Series 3: DWI · coronal · 4.0mm · 0.94mm/px · 3 of 76 slices shown (2 of 2)]
[im 11/76]
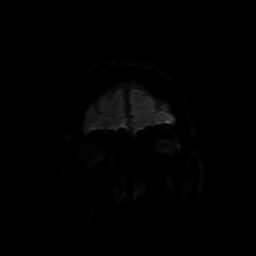
[im 43/76]
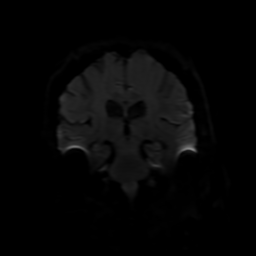
[im 65/76]
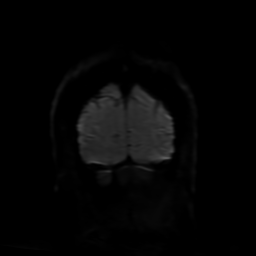

[Series 4: FLAIR · sagittal · 5.0mm · 0.23mm/px · 3 of 25 slices shown (1 of 2)]
[im 1/25]
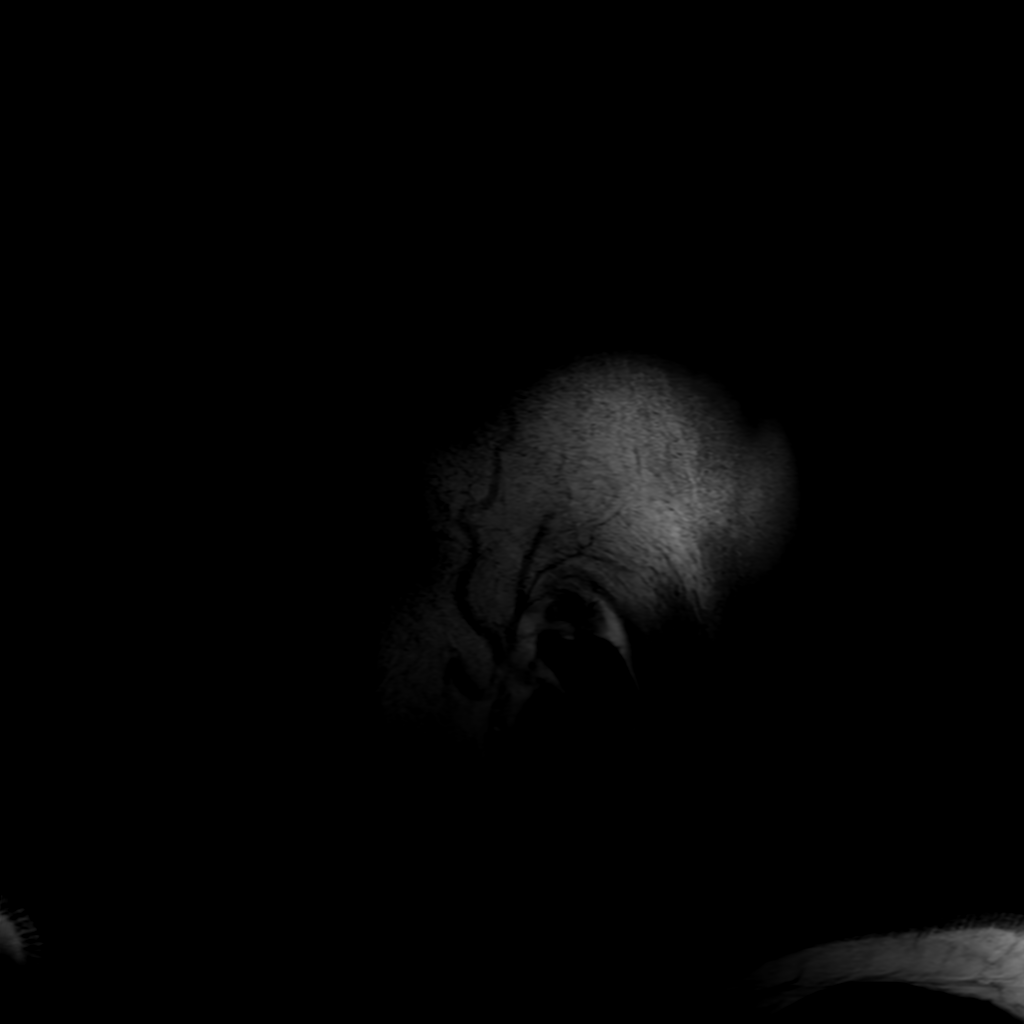
[im 13/25]
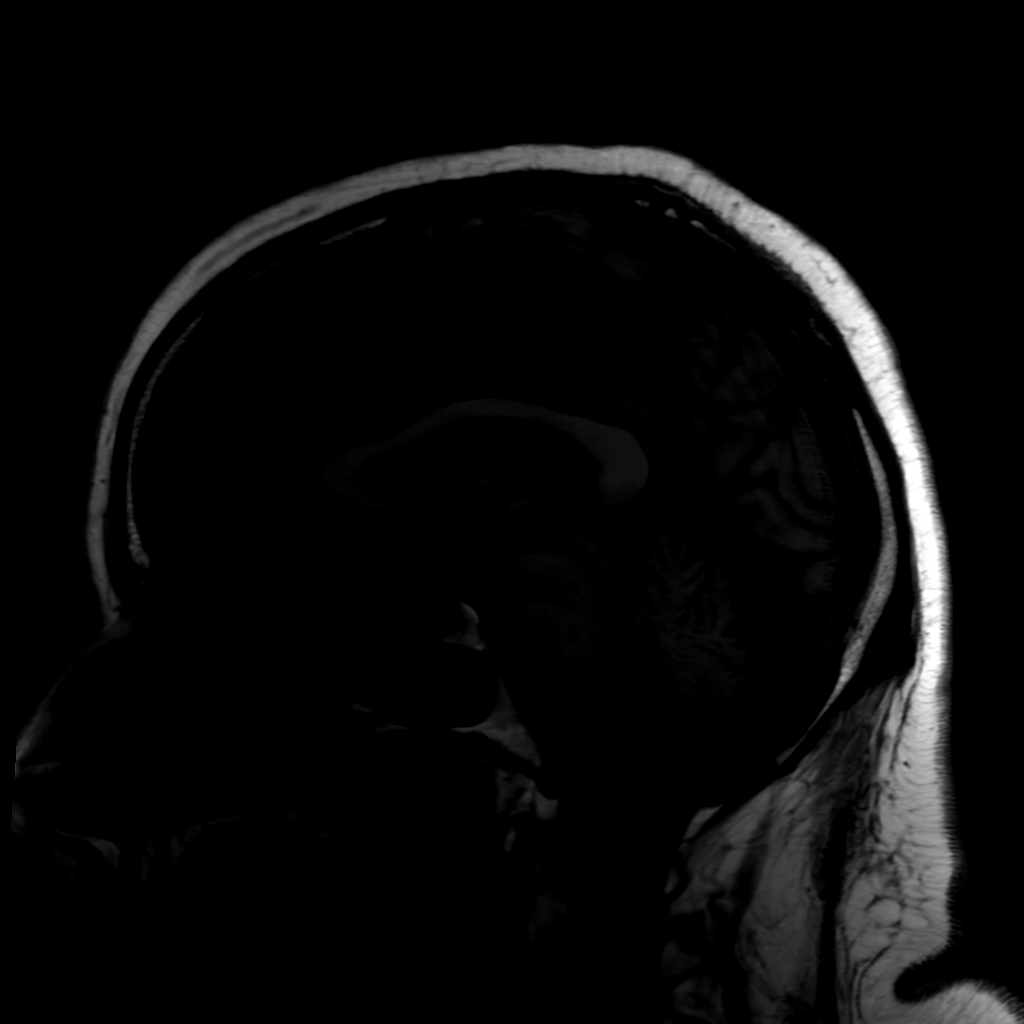
[im 25/25]
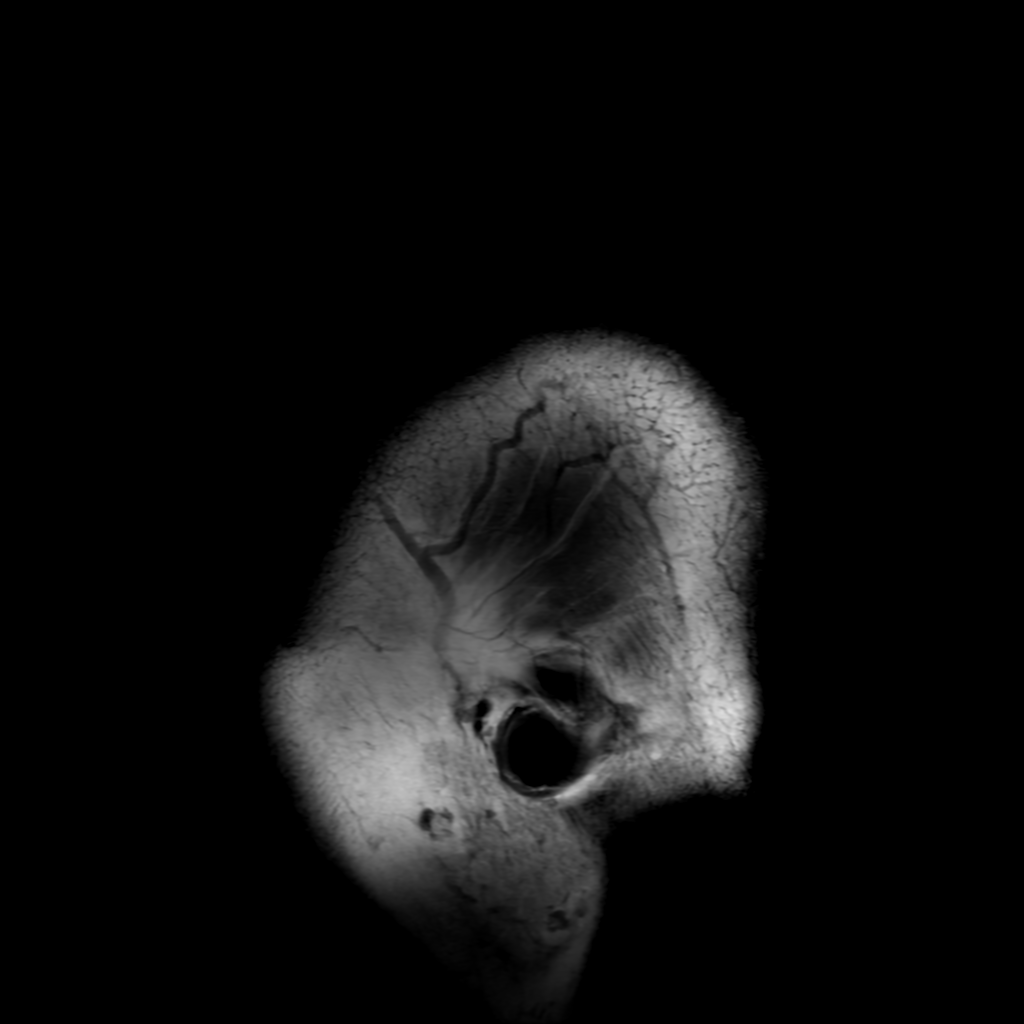

[Series 7: FLAIR · axial · 5.0mm · 0.47mm/px · z∈[-153,+4]mm · 3 of 28 slices shown (2 of 2)]
[im 1/28]
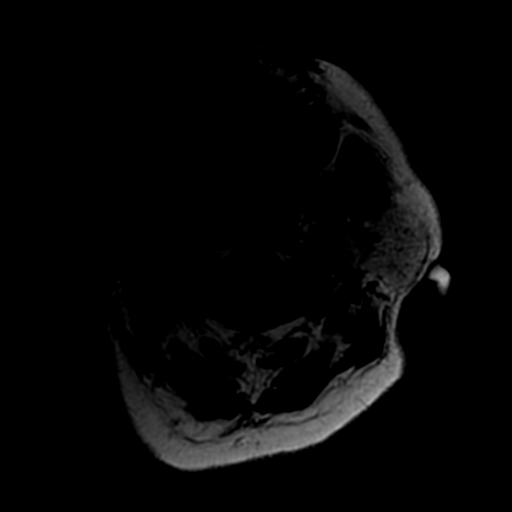
[im 14/28]
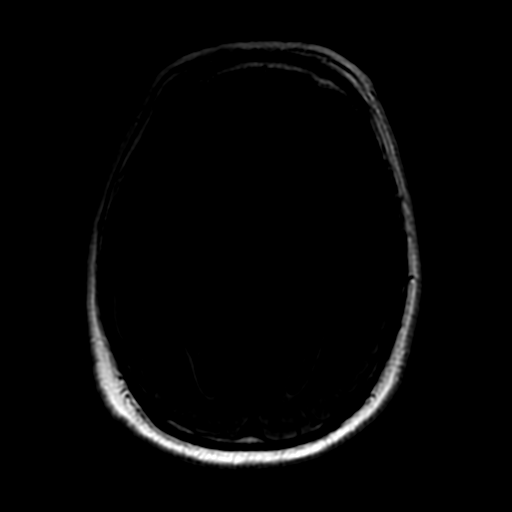
[im 28/28]
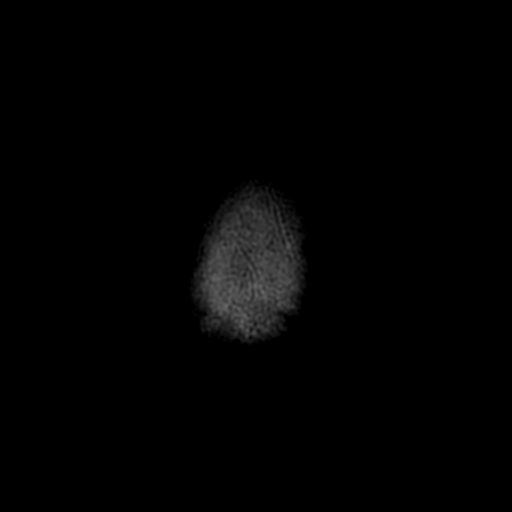

[18 of 48 positions shown; findings below may reference images not displayed]

FINDINGS: MRI HEAD FINDINGS

Brain: No restricted diffusion to suggest acute infarction. No
midline shift, mass effect, evidence of mass lesion,
ventriculomegaly, extra-axial collection or acute intracranial
hemorrhage. Cervicomedullary junction and pituitary are within
normal limits. Mega cisterna magna, normal variant.

Scattered mild to moderate for age, occasionally patchy bilateral
cerebral white matter T2 and FLAIR hyperintensity. And on T2*
imaging numerous chronic microhemorrhages scattered throughout the
brain, including some suspected within the brainstem. Cerebellum
might be spared. No cortical encephalomalacia identified. No blood
or debris within the ventricular system. No T1 or T2 signal changes
within the deep gray matter nuclei. But there is a small chronic
left cerebellar linear infarct on series 9, image 9.

Vascular: Major intracranial vascular flow voids are preserved.

Skull and upper cervical spine: Negative. Visualized bone marrow
signal is within normal limits.

Sinuses/Orbits: Postoperative changes to both globes. Paranasal
Visualized paranasal sinuses and mastoids are stable and well
aerated.

Other: Visible internal auditory structures appear normal. Negative
visible scalp and face.

MRA HEAD FINDINGS

Anterior circulation: Antegrade flow in both ICA siphons. Bilateral
siphon irregularity in keeping with atherosclerosis, maximal in the
left supraclinoid segment although only mild stenosis results.
Patent carotid termini. Normal ophthalmic artery origins. Normal MCA
and ACA origins. Mildly dominant left A1. Normal anterior
communicating artery. Patent MCA M1 segments and bifurcations
without stenosis. Visible bilateral MCA and ACA branches are within
normal limits.

Posterior circulation: Antegrade flow in the posterior circulation
with codominant distal vertebral arteries. Normal right PICA origin.
No distal vertebral or vertebrobasilar junction stenosis. Patent
basilar artery without stenosis. Normal SCA and PCA origins.
Posterior communicating arteries are diminutive or absent. Bilateral
PCA branches are within normal limits.

Other findings: Occasional chronic microhemorrhage related
susceptibility is visible.

Anatomic variants: Mildly dominant left ACA A1.
IMPRESSION: 1. No acute intracranial abnormality.

2. Mild to moderate for age nonspecific cerebral white matter signal
changes with a small chronic infarct in the left cerebellum.
Multiple chronic micro-hemorrhages scattered in the brain. Given
their extent on T2* imaging Amyloid Angiopathy is difficult to
exclude. Follow-up SWI imaging may be valuable.

3. Intracranial atherosclerosis with no hemodynamically significant
stenosis on intracranial MRA.

## 2021-11-04 IMAGING — MR MR MRA HEAD W/O CM
2 series · 18 of 48 positions shown · non-contrast
Comparison: CT head, CTA head and neck yesterday.

CLINICAL DATA: 65-year-old male code stroke presentation yesterday.

EXAM:
MRI HEAD WITHOUT CONTRAST
MRA HEAD WITHOUT CONTRAST
TECHNIQUE: Multiplanar, multi-echo pulse sequences of the brain and surrounding
structures were acquired without intravenous contrast. Angiographic
images of the Circle of Willis were acquired using MRA technique
without intravenous contrast.

[Series 5: ax (id) · axial · 1.0mm · 0.43mm/px · z∈[-126,-45]mm · 17 of 176 slices shown]
[im 1/176]
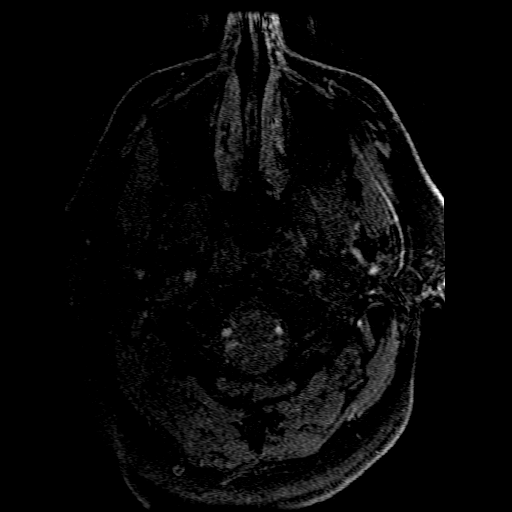
[im 4/176]
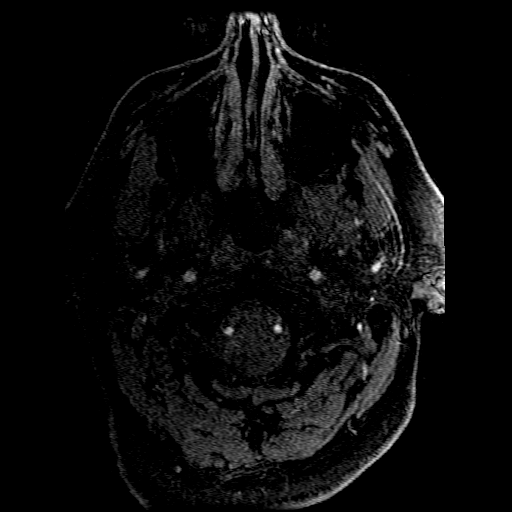
[im 8/176]
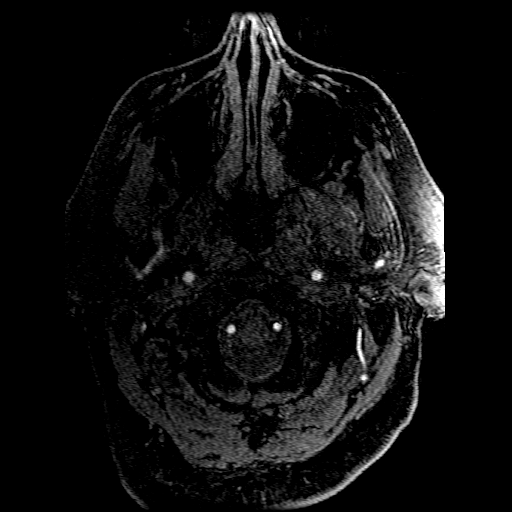
[im 12/176]
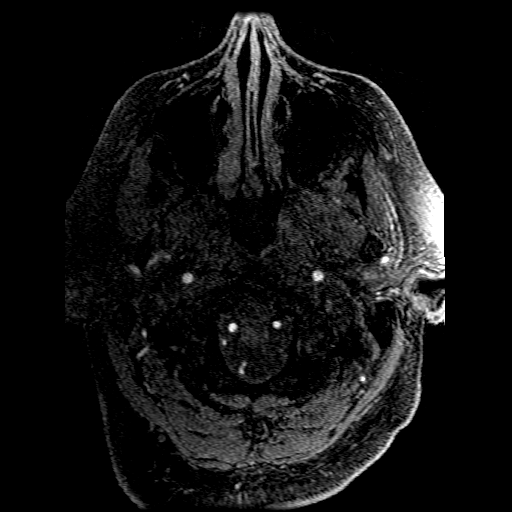
[im 16/176]
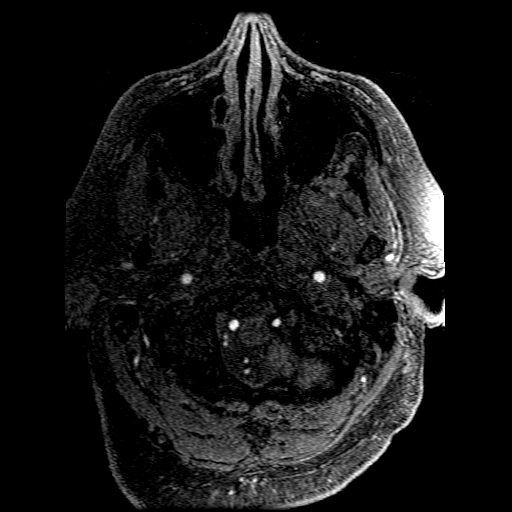
[im 20/176]
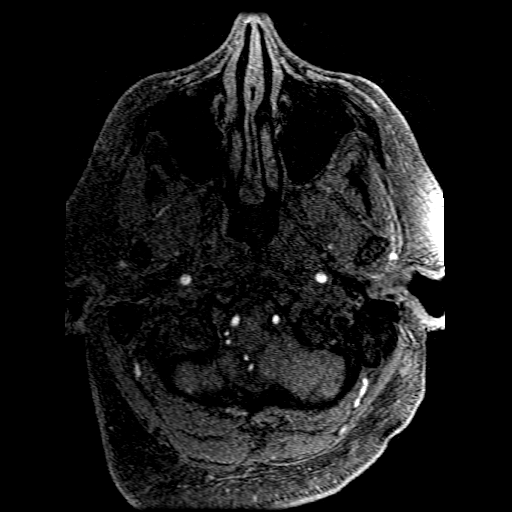
[im 23/176]
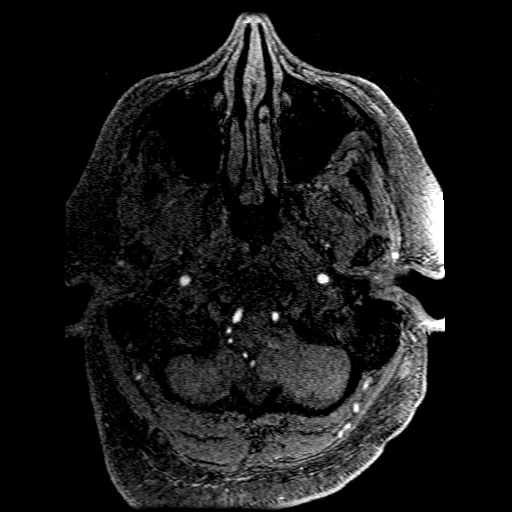
[im 27/176]
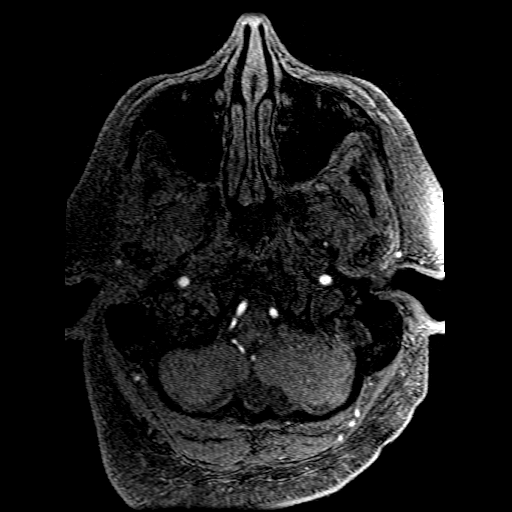
[im 31/176]
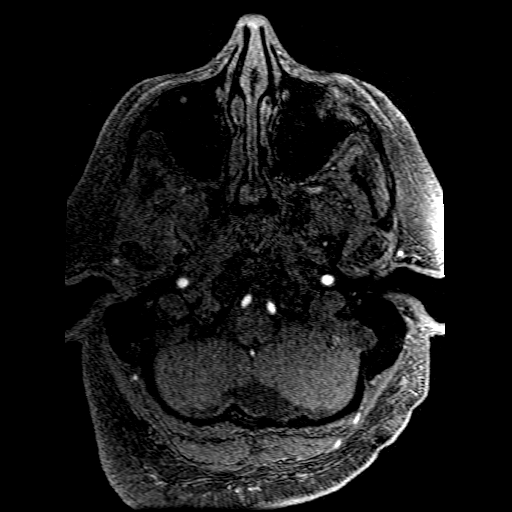
[im 54/176]
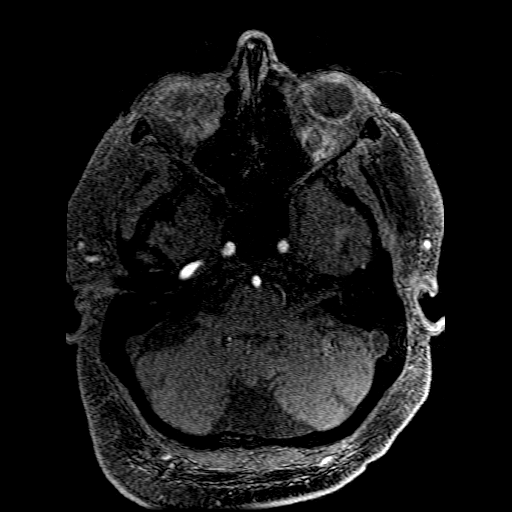
[im 77/176]
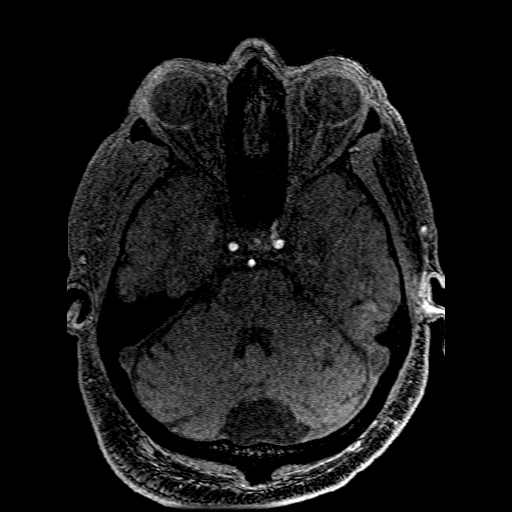
[im 88/176]
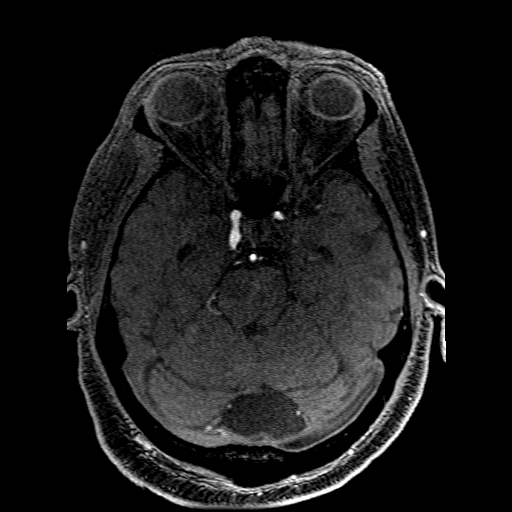
[im 99/176]
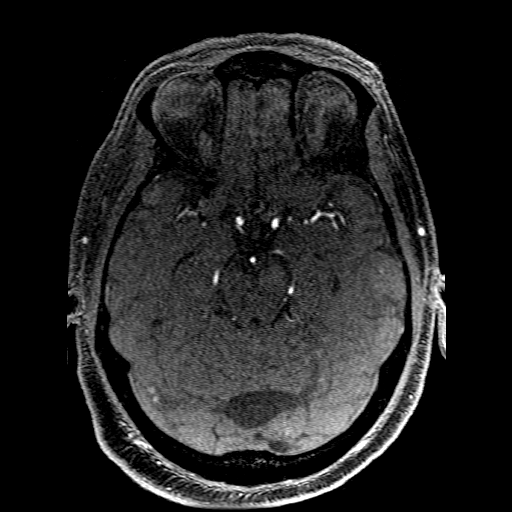
[im 122/176]
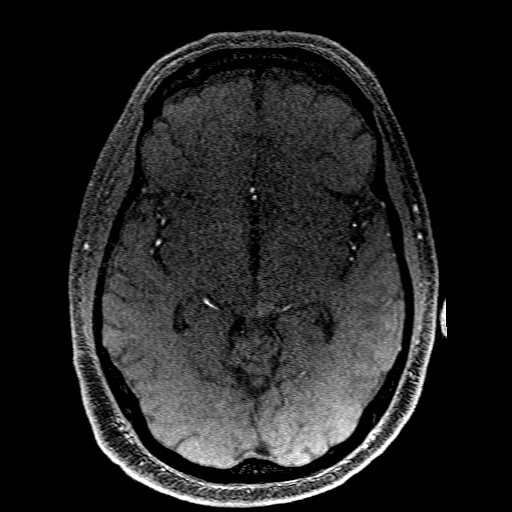
[im 145/176]
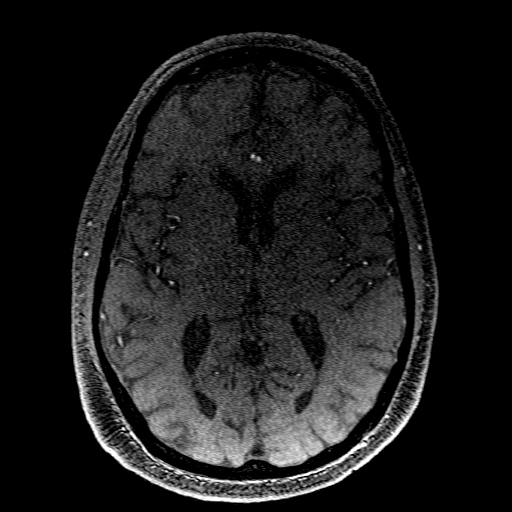
[im 149/176]
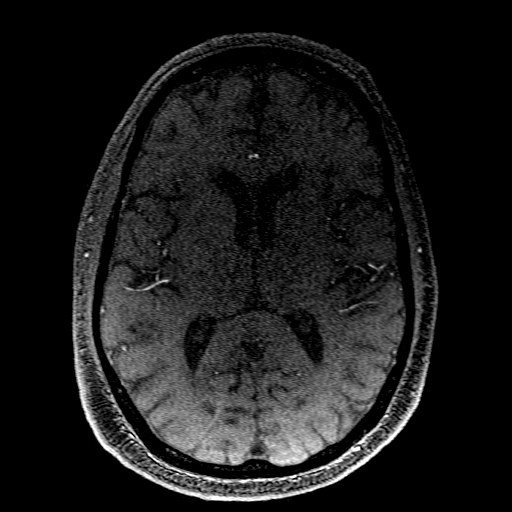
[im 168/176]
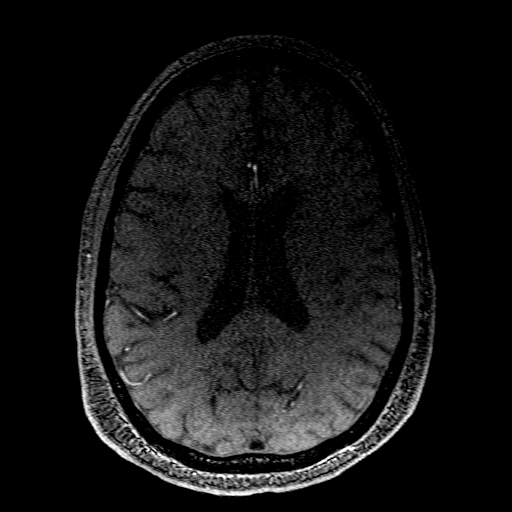

[Series 500: col:ax (id) · axial · 1.0mm · 0.43mm/px · 1 of 1 slices shown]
[im 1/1]
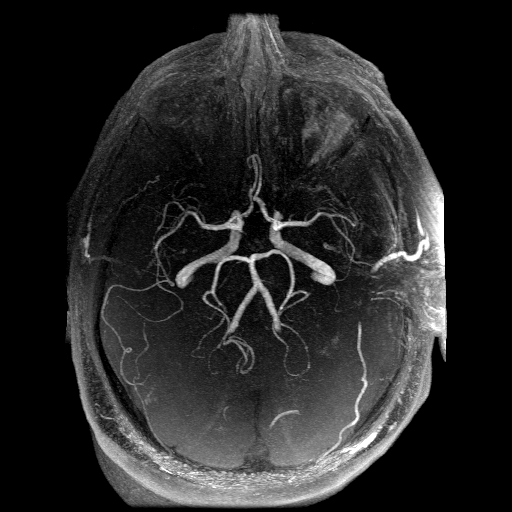

[18 of 48 positions shown; findings below may reference images not displayed]

FINDINGS: MRI HEAD FINDINGS

Brain: No restricted diffusion to suggest acute infarction. No
midline shift, mass effect, evidence of mass lesion,
ventriculomegaly, extra-axial collection or acute intracranial
hemorrhage. Cervicomedullary junction and pituitary are within
normal limits. Mega cisterna magna, normal variant.

Scattered mild to moderate for age, occasionally patchy bilateral
cerebral white matter T2 and FLAIR hyperintensity. And on T2*
imaging numerous chronic microhemorrhages scattered throughout the
brain, including some suspected within the brainstem. Cerebellum
might be spared. No cortical encephalomalacia identified. No blood
or debris within the ventricular system. No T1 or T2 signal changes
within the deep gray matter nuclei. But there is a small chronic
left cerebellar linear infarct on series 9, image 9.

Vascular: Major intracranial vascular flow voids are preserved.

Skull and upper cervical spine: Negative. Visualized bone marrow
signal is within normal limits.

Sinuses/Orbits: Postoperative changes to both globes. Paranasal
Visualized paranasal sinuses and mastoids are stable and well
aerated.

Other: Visible internal auditory structures appear normal. Negative
visible scalp and face.

MRA HEAD FINDINGS

Anterior circulation: Antegrade flow in both ICA siphons. Bilateral
siphon irregularity in keeping with atherosclerosis, maximal in the
left supraclinoid segment although only mild stenosis results.
Patent carotid termini. Normal ophthalmic artery origins. Normal MCA
and ACA origins. Mildly dominant left A1. Normal anterior
communicating artery. Patent MCA M1 segments and bifurcations
without stenosis. Visible bilateral MCA and ACA branches are within
normal limits.

Posterior circulation: Antegrade flow in the posterior circulation
with codominant distal vertebral arteries. Normal right PICA origin.
No distal vertebral or vertebrobasilar junction stenosis. Patent
basilar artery without stenosis. Normal SCA and PCA origins.
Posterior communicating arteries are diminutive or absent. Bilateral
PCA branches are within normal limits.

Other findings: Occasional chronic microhemorrhage related
susceptibility is visible.

Anatomic variants: Mildly dominant left ACA A1.
IMPRESSION: 1. No acute intracranial abnormality.

2. Mild to moderate for age nonspecific cerebral white matter signal
changes with a small chronic infarct in the left cerebellum.
Multiple chronic micro-hemorrhages scattered in the brain. Given
their extent on T2* imaging Amyloid Angiopathy is difficult to
exclude. Follow-up SWI imaging may be valuable.

3. Intracranial atherosclerosis with no hemodynamically significant
stenosis on intracranial MRA.

## 2021-11-04 IMAGING — CT CT HEAD W/O CM
3 series · 15 of 37 positions shown, 17 images · non-contrast
Comparison: MRI brain and MRA head [DATE].

CLINICAL DATA: Stroke, follow-up; vomiting, headache, speech
changes.



[Series 3: head wo · axial · 0.47mm/px · z∈[-230,-85]mm · 7 of 39 slices shown, 9 images]
[im 5/39  brain]
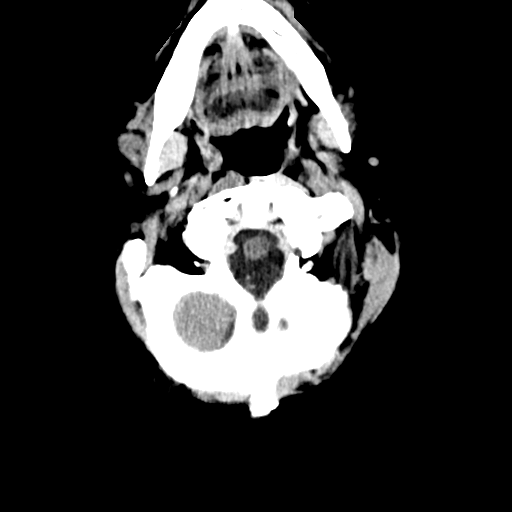
[im 5/39  bone]
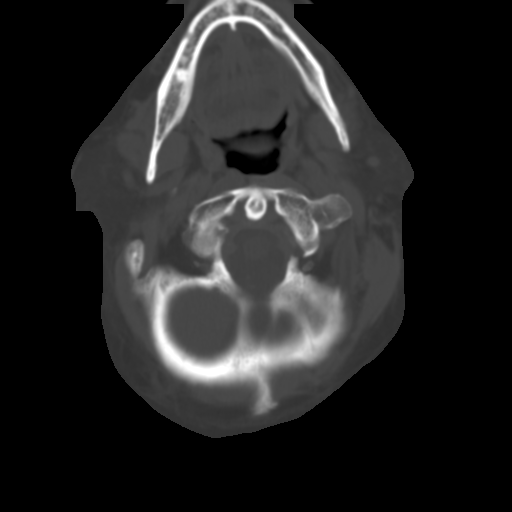
[im 10/39  brain]
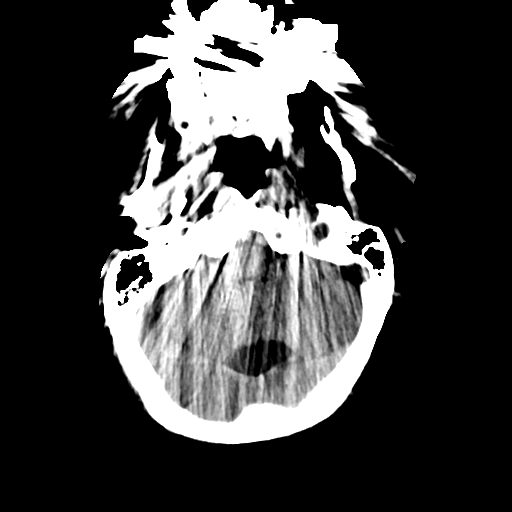
[im 15/39  brain]
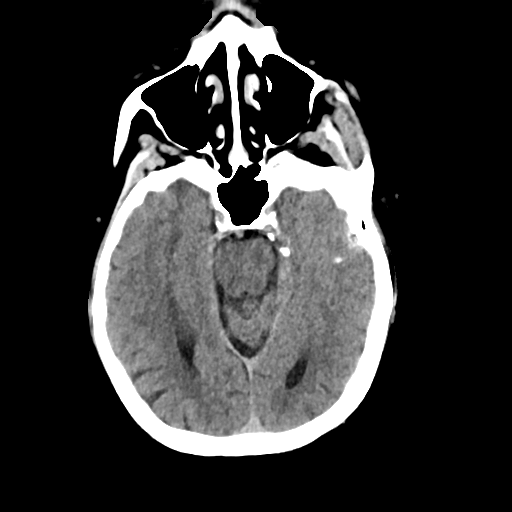
[im 20/39  brain]
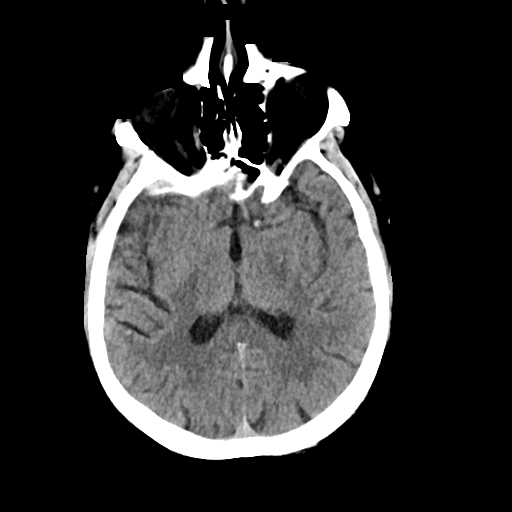
[im 24/39  brain]
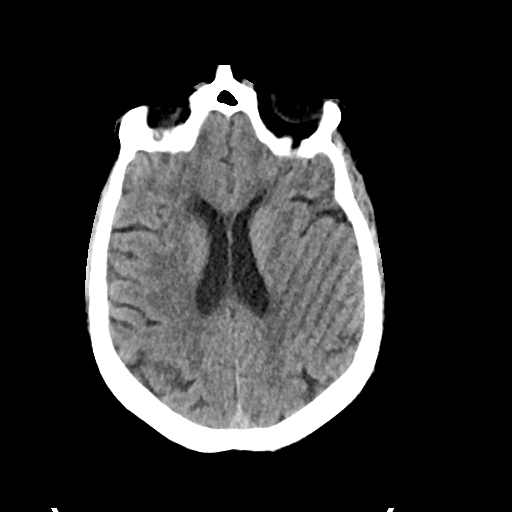
[im 24/39  bone]
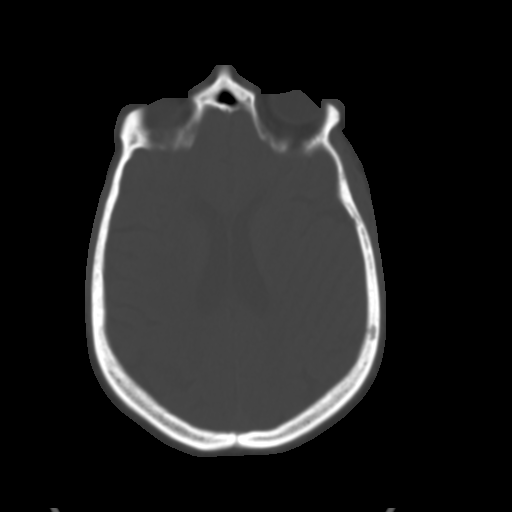
[im 29/39  brain]
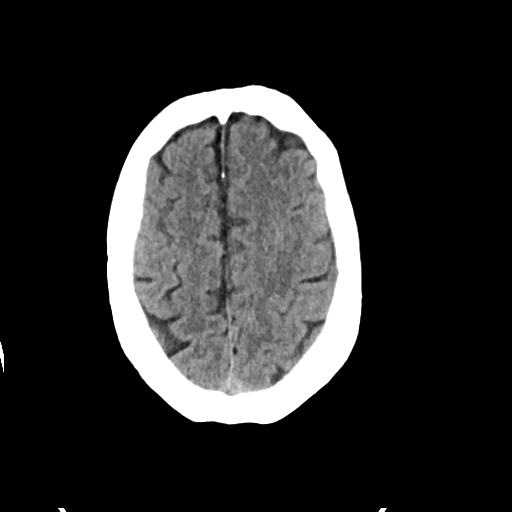
[im 34/39  brain]
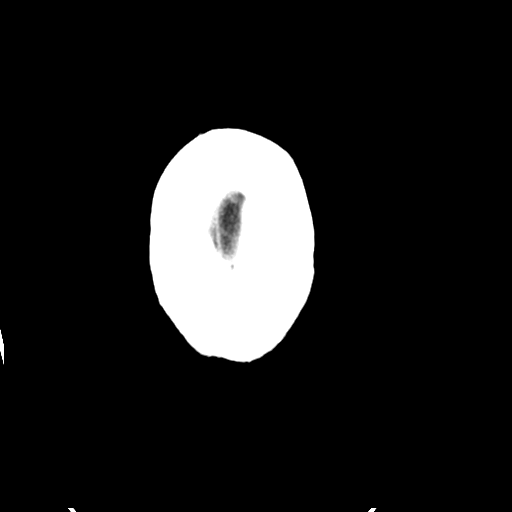

[Series 4: head bone · axial · 0.47mm/px · z∈[-232,-146]mm · 5 of 97 slices shown]
[im 10/97  bone]
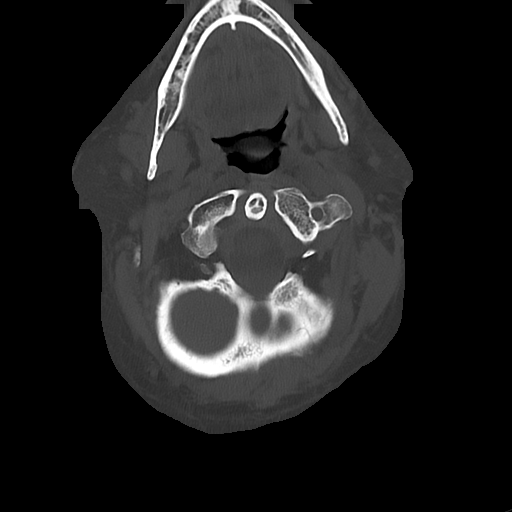
[im 20/97  bone]
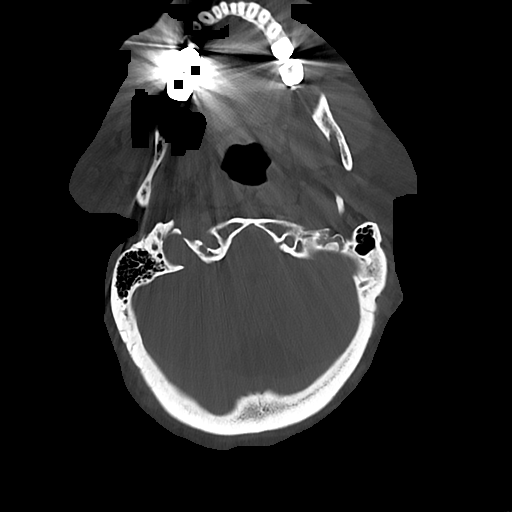
[im 29/97  bone]
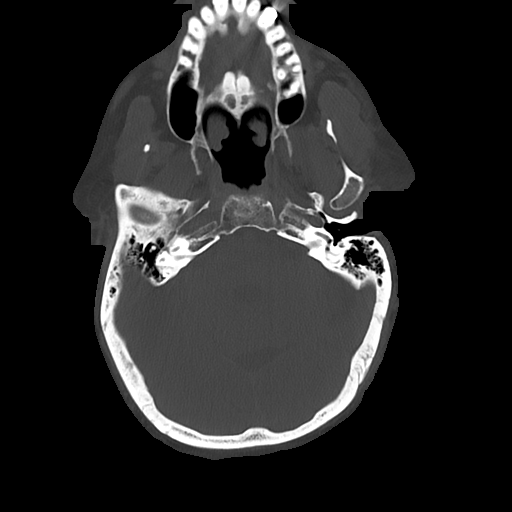
[im 44/97  bone]
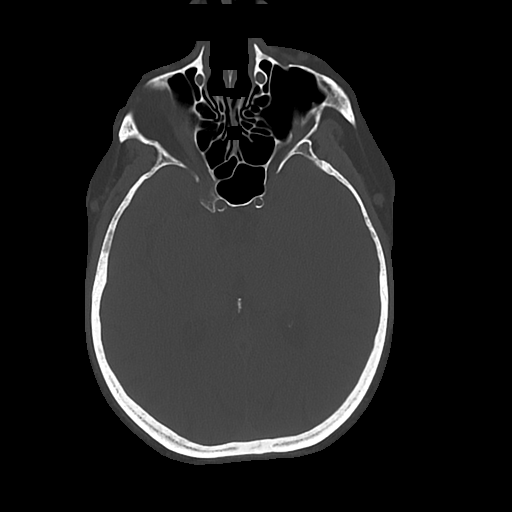
[im 53/97  bone]
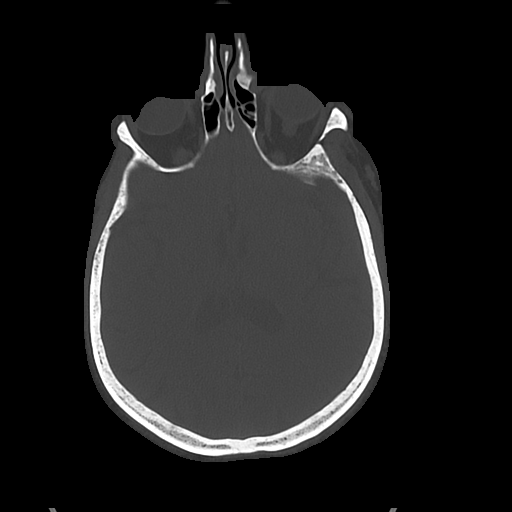

[Series 6: sag soft · sagittal · 0.33mm/px · 3 of 61 slices shown]
[im 21/61  brain]
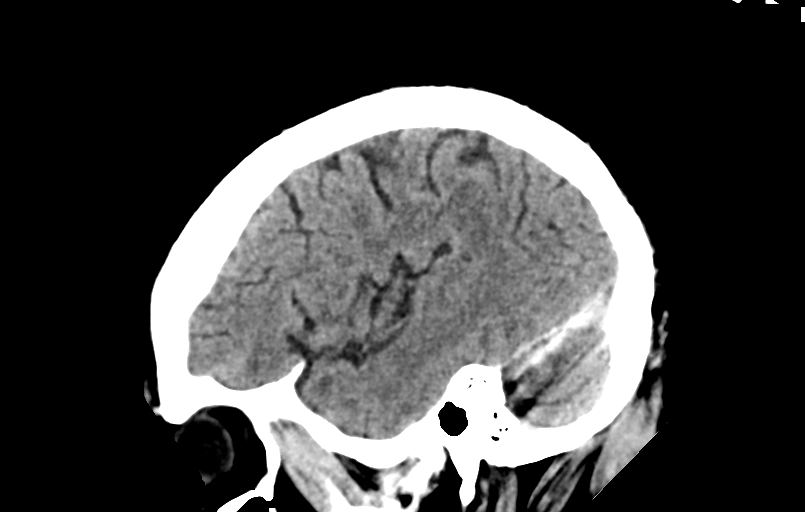
[im 31/61  brain]
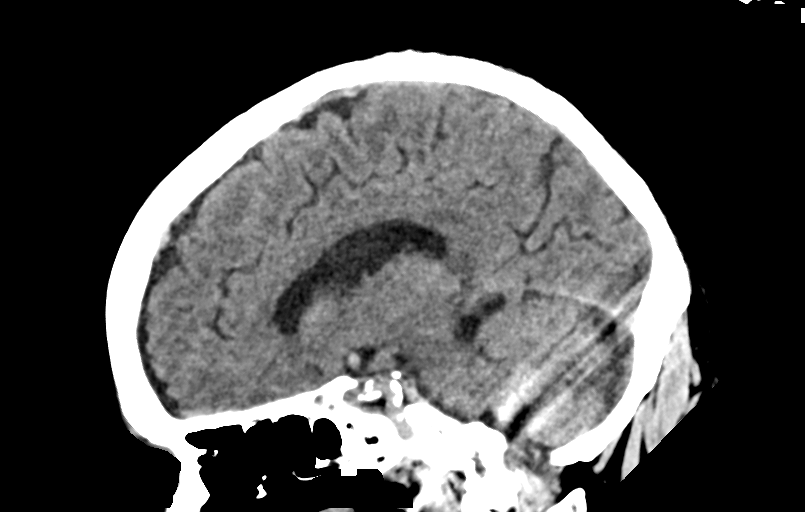
[im 41/61  brain]
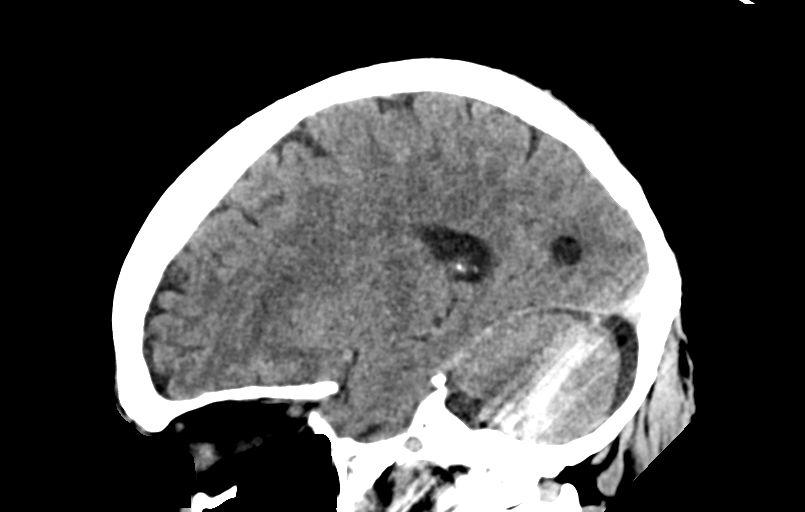

[15 of 37 positions shown; findings below may reference images not displayed]

FINDINGS: Brain:

Streak and beam hardening artifact arising from dental restoration
partially obscures the posterior fossa contents.

No age advanced or lobar predominant cerebral atrophy.

Mild-to-moderate patchy and ill-defined hypoattenuation within the
cerebral white matter, compatible with nonspecific chronic cerebral
white matter disease.

A known small chronic infarct within the left cerebellar hemisphere
was better appreciated on the brain MRI performed earlier today.

There is no acute intracranial hemorrhage.

No demarcated cortical infarct.

No extra-axial fluid collection.

No evidence of an intracranial mass.

No midline shift.

Vascular: No hyperdense vessel.  Atherosclerotic calcifications.

Skull: Normal. Negative for fracture or focal lesion.

Sinuses/Orbits: Visualized orbits show no acute finding. No
significant paranasal sinus disease.
IMPRESSION: Streak and beam hardening artifact arising from dental restoration
partially obscures the posterior fossa contents.

No evidence of acute intracranial abnormality or appreciable change
from the MRI brain performed earlier today.

## 2021-11-04 MED ORDER — LABETALOL HCL 5 MG/ML IV SOLN
5.0000 mg | INTRAVENOUS | Status: DC | PRN
  Administered 2021-11-05: 10 mg via INTRAVENOUS
  Filled 2021-11-04: qty 4

## 2021-11-04 MED ORDER — PANTOPRAZOLE SODIUM 40 MG PO TBEC
40.0000 mg | DELAYED_RELEASE_TABLET | Freq: Every day | ORAL | Status: DC
Start: 2021-11-04 — End: 2021-11-06
  Administered 2021-11-04 – 2021-11-06 (×3): 40 mg via ORAL
  Filled 2021-11-04 (×3): qty 1

## 2021-11-04 MED ORDER — SODIUM CHLORIDE 0.9 % IV SOLN: INTRAVENOUS | Status: DC

## 2021-11-04 MED ORDER — ATORVASTATIN CALCIUM 80 MG PO TABS
80.0000 mg | ORAL_TABLET | Freq: Every day | ORAL | Status: DC
  Administered 2021-11-05 – 2021-11-06 (×2): 80 mg via ORAL
  Filled 2021-11-04 (×2): qty 1

## 2021-11-04 MED ORDER — LABETALOL HCL 5 MG/ML IV SOLN
5.0000 mg | INTRAVENOUS | Status: DC | PRN
  Administered 2021-11-04: 20 mg via INTRAVENOUS
  Filled 2021-11-04: qty 4

## 2021-11-04 MED ORDER — CHLORHEXIDINE GLUCONATE CLOTH 2 % EX PADS
6.0000 | MEDICATED_PAD | Freq: Every day | CUTANEOUS | Status: DC
  Administered 2021-11-04 – 2021-11-05 (×2): 6 via TOPICAL

## 2021-11-04 MED ORDER — CLEVIDIPINE BUTYRATE 0.5 MG/ML IV EMUL: 0.0000 mg/h | INTRAVENOUS | Status: DC

## 2021-11-04 MED ORDER — ASPIRIN EC 81 MG PO TBEC
81.0000 mg | DELAYED_RELEASE_TABLET | Freq: Every day | ORAL | Status: DC
  Administered 2021-11-04 – 2021-11-06 (×3): 81 mg via ORAL
  Filled 2021-11-04 (×3): qty 1

## 2021-11-04 MED ORDER — PERFLUTREN LIPID MICROSPHERE
1.0000 mL | INTRAVENOUS | Status: AC | PRN
  Administered 2021-11-04: 5 mL via INTRAVENOUS
  Filled 2021-11-04: qty 10

## 2021-11-04 MED ORDER — CLOPIDOGREL BISULFATE 75 MG PO TABS
75.0000 mg | ORAL_TABLET | Freq: Every day | ORAL | Status: DC
  Administered 2021-11-04 – 2021-11-06 (×3): 75 mg via ORAL
  Filled 2021-11-04 (×3): qty 1

## 2021-11-04 NOTE — Progress Notes (Signed)
PT Cancellation Note ? ?Patient Details ?Name: Franklin Calderon ?MRN: [id number] ?DOB: March 04, 1956 ? ? ?Cancelled Treatment:    Reason Eval/Treat Not Completed: Active bedrest order ? ? ?[name] ?11/04/2021, 7:16 AM ? ?[name], PT  ?Office # [phone] ?Pager [phone] ? ?

## 2021-11-04 NOTE — Evaluation (Signed)
Speech Language Pathology Evaluation ?Patient Details ?Name: Franklin Calderon ?MRN: [id number] ?DOB: 05-28-1956 ?Today's Date: 11/04/2021 ?Time: 3009-2330 ?SLP Time Calculation (min) (ACUTE ONLY): 13 min ? ?Problem List:  ?Patient Active Problem List  ? Diagnosis Date Noted  ? Stroke determined by clinical assessment (HCC) 11/03/2021  ? AF (paroxysmal atrial fibrillation) (HCC) 03/01/2015  ? H/O aortic valve replacement 03/01/2015  ? H/O coronary artery bypass surgery 03/01/2015  ? Diabetes mellitus, type 2 (HCC) 02/08/2015  ? Aorta disorder (HCC) 01/24/2015  ? Benign essential HTN 01/24/2015  ? Combined fat and carbohydrate induced hyperlipemia 01/24/2015  ? ?Past Medical History:  ?Past Medical History:  ?Diagnosis Date  ? Coronary artery disease   ? Diabetes mellitus without complication (HCC)   ? Hyperlipidemia   ? Hypertension   ? ?Past Surgical History:  ?Past Surgical History:  ?Procedure Laterality Date  ? AORTIC VALVE REPLACEMENT  02/10/2015  ? CORONARY ARTERY BYPASS GRAFT    ? ?HPI:  ?Franklin Calderon is a 66 y.o. male with PMH significant for DM2, HTN, HLD, CAD s/p CABG. Admitted with sudden onset R hand numbness and clumsiness, word finding and R facial droop. CT neg, MRI pending. Received TNK. Questionable drug reaction (Benadryl?) as he became significantly agitiated and unable to follow commands. He is weaning from Precedex.  ? ?Assessment / Plan / Recommendation ?Clinical Impression ? Pt's Precedex weaned all morning and RN turned off prior to this assessment with wife at bedside. He was awake but somewhat drowsy and groggy with normal speech and language capabilities. Suspect cognitive status impacted by residual sedation and scored a 21/30 on SLUMS. 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 His wife handles the house finances. Suspect as sedation clears his cognition will return to baseline. Educated  wife that if there are concerns when he returns home to inquire about outpatient ST however do not feel he will need this. No further ST. ?   ?SLP Assessment ? SLP Recommendation/Assessment: Patient does not need any further Speech Lanaguage Pathology Services ?SLP Visit Diagnosis: Cognitive communication deficit (R41.841)  ?  ?Recommendations for follow up therapy are one component of a multi-disciplinary discharge planning process, led by the attending physician.  Recommendations may be updated based on patient status, additional functional criteria and insurance authorization. ?   ?Follow Up Recommendations ? No SLP follow up  ?  ?Assistance Recommended at Discharge ? None  ?Functional Status Assessment    ?Frequency and Duration    ?  ?  ?   ?SLP Evaluation ?Cognition ? Overall Cognitive Status: Impaired/Different from baseline (suspected impact by residual Precedex) ?Arousal/Alertness:  (slightly drowsy) ?Orientation Level: Oriented X4 ?Attention: Sustained ?Sustained Attention: Appears intact ?Memory: Impaired ?Memory Impairment: Retrieval deficit ?Awareness: Appears intact ?Problem Solving: Impaired ?Problem Solving Impairment: Verbal basic ?Safety/Judgment:  (will improve as alertness/awareness improves)  ?  ?   ?Comprehension ? Auditory Comprehension ?Overall Auditory Comprehension: Appears within functional limits for tasks assessed ?Visual Recognition/Discrimination ?Discrimination: Not tested ?Reading Comprehension ?Reading Status: Not tested  ?  ?Expression Expression ?Primary Mode of Expression: Verbal ?Verbal Expression ?Overall Verbal Expression: Appears within functional limits for tasks assessed ?Pragmatics: No impairment ?Written Expression ?Dominant Hand: Right ?Written Expression: Not tested   ?Oral / Motor ? Oral Motor/Sensory Function ?Overall Oral Motor/Sensory Function: Within functional limits ?Motor Speech ?Overall Motor Speech: Appears within functional limits for tasks  assessed ?Intelligibility: Intelligible ?Motor Planning: Witnin functional limits   ?        ? ?  Franklin Calderon ?11/04/2021, 11:43 AM ? ?

## 2021-11-04 NOTE — Progress Notes (Signed)
OT Cancellation Note ? ?Patient Details ?Name: Franklin Calderon ?MRN: [id number] ?DOB: 08/12/1956 ? ? ?Cancelled Treatment:    Reason Eval/Treat Not Completed: Active bedrest order (Will assess when activity orders updated.) ? ?Junice Fei,HILLARY ?[facility], OT/L  ? ?Acute OT Clinical Specialist ?Acute Rehabilitation Services ?Pager [phone] ?Office [phone]  ?11/04/2021, 7:40 AM ?

## 2021-11-04 NOTE — Progress Notes (Signed)
*  PRELIMINARY RESULTS* ?Echocardiogram ?2D Echocardiogram has been performed. ? ?Gerda Diss ?11/04/2021, 2:49 PM ?

## 2021-11-04 NOTE — Progress Notes (Signed)
After moving patient from chair back to bed, he became expressively aphasic. His speech was garbled and he was not making sense. He has since returned back to his baseline. Possible orthostatic hypotension. MD Roda Shutters) aware.  ? ?Beryl Meager, RN ?

## 2021-11-04 NOTE — Progress Notes (Signed)
STROKE TEAM PROGRESS NOTE   SUBJECTIVE (INTERVAL HISTORY) His wife is at the bedside.  Overall his condition is completely resolved. Pt received a TNK yesterday, and Benadryl IV prior to TNK given hypertension and white coat syndrome.  However, patient developed agitation, combative, delirium once arrived to 4 N. ICU, stat CT negative, stat EEG no seizure.  He received another dose of Benadryl after along with Ativan and Haldol.  However, eventually he needed Precedex maximized dose to calm down.  Overnight patient did well, this morning patient calm, cooperative, fully orientated, back to baseline.  MRI no acute infarct.  Later called by RN, that pt had some orthostasis with OT. Then on transfer him from chair to bed, he had again aphasia, then laid him down, BP 160s, gradually pt speech resolved. Per OT note, "BP taken after activity: sitting 108/54; standing 87/68 (pt unable to remain standing for pressure), after 1 min in sitting 125/65."   OBJECTIVE Temp:  [98.3 F (36.8 C)-99.1 F (37.3 C)] 98.3 F (36.8 C) (03/05 0756) Pulse Rate:  [65-120] 70 (03/05 1300) Cardiac Rhythm: Normal sinus rhythm (03/05 0800) Resp:  [8-35] 17 (03/05 1300) BP: (103-208)/(55-121) 118/58 (03/05 1300) SpO2:  [90 %-98 %] 96 % (03/05 1300)  Recent Labs  Lab 11/03/21 1012 11/03/21 1513 11/03/21 2113 11/04/21 0734 11/04/21 1217  GLUCAP 109* 155* 199* 203* 162*   Recent Labs  Lab 11/03/21 1016 11/03/21 1017 11/04/21 1013  NA 133* 133* 130*  K 4.5 4.5 4.2  CL 100 98 97*  CO2 25  --  24  GLUCOSE 111* 106* 173*  BUN 13 15 18   CREATININE 0.99 0.90 1.01  CALCIUM 9.1  --  9.1   Recent Labs  Lab 11/03/21 1016  AST 19  ALT 18  ALKPHOS 59  BILITOT 0.6  PROT 6.7  ALBUMIN 4.0   Recent Labs  Lab 11/03/21 1016 11/03/21 1017 11/04/21 1013  WBC 8.2  --  13.9*  NEUTROABS 3.9  --   --   HGB 15.9 16.7 15.2  HCT 47.1 49.0 44.6  MCV 94.2  --  92.3  PLT 258  --  227   No results for input(s):  CKTOTAL, CKMB, CKMBINDEX, TROPONINI in the last 168 hours. Recent Labs    11/03/21 1016  LABPROT 13.8  INR 1.1   No results for input(s): COLORURINE, LABSPEC, PHURINE, GLUCOSEU, HGBUR, BILIRUBINUR, KETONESUR, PROTEINUR, UROBILINOGEN, NITRITE, LEUKOCYTESUR in the last 72 hours.  Invalid input(s): APPERANCEUR     Component Value Date/Time   CHOL 241 (H) 11/04/2021 0326   TRIG 46 11/04/2021 0326   HDL 50 11/04/2021 0326   CHOLHDL 4.8 11/04/2021 0326   VLDL 9 11/04/2021 0326   LDLCALC 182 (H) 11/04/2021 0326   Lab Results  Component Value Date   HGBA1C 6.4 (H) 11/04/2021   No results found for: LABOPIA, COCAINSCRNUR, LABBENZ, AMPHETMU, THCU, LABBARB  No results for input(s): ETH in the last 168 hours.  I have personally reviewed the radiological images below and agree with the radiology interpretations.  CT HEAD WO CONTRAST (5MM)  Result Date: 11/03/2021 CLINICAL DATA:  CVA EXAM: CT HEAD WITHOUT CONTRAST TECHNIQUE: Contiguous axial images were obtained from the base of the skull through the vertex without intravenous contrast. RADIATION DOSE REDUCTION: This exam was performed according to the departmental dose-optimization program which includes automated exposure control, adjustment of the mA and/or kV according to patient size and/or use of iterative reconstruction technique. COMPARISON:  Previous study done earlier today FINDINGS:  Brain: No acute intracranial findings are seen. Ventricles are not dilated. There is no shift of midline structures. Cortical sulci are prominent. Cisterna magna is prominent. Vascular: Unremarkable. Skull: Unremarkable. Sinuses/Orbits: There is mild mucosal thickening in the ethmoid sinus. Other: None IMPRESSION: No acute intracranial findings are seen in noncontrast CT brain. Atrophy. Electronically Signed   By: Elmer Picker M.D.   On: 11/03/2021 14:47   MR ANGIO HEAD WO CONTRAST  Result Date: 11/04/2021 CLINICAL DATA:  66 year old male code stroke  presentation yesterday. EXAM: MRI HEAD WITHOUT CONTRAST MRA HEAD WITHOUT CONTRAST TECHNIQUE: Multiplanar, multi-echo pulse sequences of the brain and surrounding structures were acquired without intravenous contrast. Angiographic images of the Circle of Willis were acquired using MRA technique without intravenous contrast. COMPARISON:  CT head, CTA head and neck yesterday. FINDINGS: MRI HEAD FINDINGS Brain: No restricted diffusion to suggest acute infarction. No midline shift, mass effect, evidence of mass lesion, ventriculomegaly, extra-axial collection or acute intracranial hemorrhage. Cervicomedullary junction and pituitary are within normal limits. Mega cisterna magna, normal variant. Scattered mild to moderate for age, occasionally patchy bilateral cerebral white matter T2 and FLAIR hyperintensity. And on T2* imaging numerous chronic microhemorrhages scattered throughout the brain, including some suspected within the brainstem. Cerebellum might be spared. No cortical encephalomalacia identified. No blood or debris within the ventricular system. No T1 or T2 signal changes within the deep gray matter nuclei. But there is a small chronic left cerebellar linear infarct on series 9, image 9. Vascular: Major intracranial vascular flow voids are preserved. Skull and upper cervical spine: Negative. Visualized bone marrow signal is within normal limits. Sinuses/Orbits: Postoperative changes to both globes. Paranasal Visualized paranasal sinuses and mastoids are stable and well aerated. Other: Visible internal auditory structures appear normal. Negative visible scalp and face. MRA HEAD FINDINGS Anterior circulation: Antegrade flow in both ICA siphons. Bilateral siphon irregularity in keeping with atherosclerosis, maximal in the left supraclinoid segment although only mild stenosis results. Patent carotid termini. Normal ophthalmic artery origins. Normal MCA and ACA origins. Mildly dominant left A1. Normal anterior  communicating artery. Patent MCA M1 segments and bifurcations without stenosis. Visible bilateral MCA and ACA branches are within normal limits. Posterior circulation: Antegrade flow in the posterior circulation with codominant distal vertebral arteries. Normal right PICA origin. No distal vertebral or vertebrobasilar junction stenosis. Patent basilar artery without stenosis. Normal SCA and PCA origins. Posterior communicating arteries are diminutive or absent. Bilateral PCA branches are within normal limits. Other findings: Occasional chronic microhemorrhage related susceptibility is visible. Anatomic variants: Mildly dominant left ACA A1. IMPRESSION: 1. No acute intracranial abnormality. 2. 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 Multiple chronic micro-hemorrhages scattered in the brain. Given their extent on T2* imaging Amyloid Angiopathy is difficult to exclude. Follow-up SWI imaging may be valuable. 3. Intracranial atherosclerosis with no hemodynamically significant stenosis on intracranial MRA. Electronically Signed   By: Genevie Ann M.D.   On: 11/04/2021 12:18   MR BRAIN WO CONTRAST  Result Date: 11/04/2021 CLINICAL DATA:  66 year old male code stroke presentation yesterday. EXAM: MRI HEAD WITHOUT CONTRAST MRA HEAD WITHOUT CONTRAST TECHNIQUE: Multiplanar, multi-echo pulse sequences of the brain and surrounding structures were acquired without intravenous contrast. Angiographic images of the Circle of Willis were acquired using MRA technique without intravenous contrast. COMPARISON:  CT head, CTA head and neck yesterday. FINDINGS: MRI HEAD FINDINGS Brain: No restricted diffusion to suggest acute infarction. No midline shift, mass effect,  evidence of mass lesion, ventriculomegaly, extra-axial collection or acute intracranial hemorrhage. Cervicomedullary junction and pituitary are within normal limits. Mega cisterna magna,  normal variant. Scattered mild to moderate for age, occasionally patchy bilateral cerebral white matter T2 and FLAIR hyperintensity. And on T2* imaging numerous chronic microhemorrhages scattered throughout the brain, including some suspected within the brainstem. Cerebellum might be spared. No cortical encephalomalacia identified. No blood or debris within the ventricular system. No T1 or T2 signal changes within the deep gray matter nuclei. But there is a small chronic left cerebellar linear infarct on series 9, image 9. Vascular: Major intracranial vascular flow voids are preserved. Skull and upper cervical spine: Negative. Visualized bone marrow signal is within normal limits. Sinuses/Orbits: Postoperative changes to both globes. Paranasal Visualized paranasal sinuses and mastoids are stable and well aerated. Other: Visible internal auditory structures appear normal. Negative visible scalp and face. MRA HEAD FINDINGS Anterior circulation: Antegrade flow in both ICA siphons. Bilateral siphon irregularity in keeping with atherosclerosis, maximal in the left supraclinoid segment although only mild stenosis results. Patent carotid termini. Normal ophthalmic artery origins. Normal MCA and ACA origins. Mildly dominant left A1. Normal anterior communicating artery. Patent MCA M1 segments and bifurcations without stenosis. Visible bilateral MCA and ACA branches are within normal limits. Posterior circulation: Antegrade flow in the posterior circulation with codominant distal vertebral arteries. Normal right PICA origin. No distal vertebral or vertebrobasilar junction stenosis. Patent basilar artery without stenosis. Normal SCA and PCA origins. Posterior communicating arteries are diminutive or absent. Bilateral PCA branches are within normal limits. Other findings: Occasional chronic microhemorrhage related susceptibility is visible. Anatomic variants: Mildly dominant left ACA A1. IMPRESSION: 1. No acute intracranial  abnormality. 2. 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 Multiple chronic micro-hemorrhages scattered in the brain. Given their extent on T2* imaging Amyloid Angiopathy is difficult to exclude. Follow-up SWI imaging may be valuable. 3. Intracranial atherosclerosis with no hemodynamically significant stenosis on intracranial MRA. Electronically Signed   By: Odessa Fleming M.D.   On: 11/04/2021 12:18   EEG adult  Result Date: 11/03/2021 Jefferson Fuel, MD     11/03/2021  5:30 PM Routine EEG Report Franklin Calderon is a 66 y.o. male with a history of stroke who is undergoing an EEG to evaluate for seizures. Report: This EEG was acquired with electrodes placed according to the International 10-20 electrode system (including Fp1, Fp2, F3, F4, C3, C4, P3, P4, O1, O2, T3, T4, T5, T6, A1, A2, Fz, Cz, Pz). The following electrodes were missing or displaced: none. There was no clear waking rhythm. The majority of the initial recording exhibited a background in the delta range approximately 3 Hz with brief interludes of alpha range activity. This activity was reactive to stimulation. Towards the end of the recording more of the background activity was in the alpha range again approximately at 8 Hz. There were occasional sleep spindles noted. There was no focal slowing. There were no interictal epileptiform discharges. There were no electrographic seizures identified. Photic stimulation and hyperventilation were not performed. Impression and clinical correlation: This EEG was obtained while sedated on precedex and is abnormal due to intermittent moderate to severe diffuse slowing indicative of global cerebral dysfunction indicative of global cerebral dysfunction, medication effect, or both. Epileptiform abnormalities were not seen during this recording. Bing Neighbors, MD [facility] [phone] If 7pm- 7am, please page neurology on  call as listed in AMION.  CT HEAD CODE STROKE WO CONTRAST  Result Date: 11/03/2021 CLINICAL DATA:  Code stroke. Neuro deficit, acute, stroke suspected. EXAM: CT HEAD WITHOUT CONTRAST TECHNIQUE: Contiguous axial images were obtained from the base of the skull through the vertex without intravenous contrast. RADIATION DOSE REDUCTION: This exam was performed according to the departmental dose-optimization program which includes automated exposure control, adjustment of the mA and/or kV according to patient size and/or use of iterative reconstruction technique. COMPARISON:  No pertinent prior exams available for comparison. FINDINGS: Brain: Mild generalized cerebral atrophy. Mild patchy and ill-defined hypoattenuation within the cerebral white matter, nonspecific but compatible chronic small vessel ischemic disease. Retrocerebellar CSF density prominence at midline, which may reflect a mega cisterna magna or posterior fossa arachnoid cyst. There is no acute intracranial hemorrhage. No demarcated cortical infarct. No extra-axial fluid collection. No evidence of an intracranial mass. No midline shift. Vascular: No hyperdense vessel.  Atherosclerotic calcifications. Skull: Normal. Negative for fracture or focal lesion. Sinuses/Orbits: Visualized orbits show no acute finding. Mild mucosal thickening within the left frontal and right ethmoid sinuses. ASPECTS (Noel Stroke Program Early CT Score) - Ganglionic level infarction (caudate, lentiform nuclei, internal capsule, insula, M1-M3 cortex): 7 - Supraganglionic infarction (M4-M6 cortex): 3 Total score (0-10 with 10 being normal): 10 These results were communicated to Dr. [name] At 10:28 amon 3/4/2023by text page via the [facility] messaging system. IMPRESSION: No evidence of acute intracranial abnormality. Mild chronic small vessel ischemic changes within the cerebral white matter. Mild generalized cerebral atrophy. Electronically Signed   By: Kellie Simmering D.O.   On:  11/03/2021 10:28   CT ANGIO HEAD NECK W WO CM (CODE STROKE)  Result Date: 11/03/2021 CLINICAL DATA:  Provided history: Neuro deficit, acute, stroke suspected. EXAM: CT ANGIOGRAPHY HEAD AND NECK TECHNIQUE: Multidetector CT imaging of the head and neck was performed using the standard protocol during bolus administration of intravenous contrast. Multiplanar CT image reconstructions and MIPs were obtained to evaluate the vascular anatomy. Carotid stenosis measurements (when applicable) are obtained utilizing NASCET criteria, using the distal internal carotid diameter as the denominator. RADIATION DOSE REDUCTION: This exam was performed according to the departmental dose-optimization program which includes automated exposure control, adjustment of the mA and/or kV according to patient size and/or use of iterative reconstruction technique. CONTRAST:  Administered contrast not known at this time. COMPARISON:  Noncontrast head CT performed earlier the same day. FINDINGS: CTA NECK FINDINGS Aortic arch: Measured aortic branching. Atherosclerotic plaque within the visualized aortic arch and proximal major branch vessels of the neck. No hemodynamically significant innominate or proximal subclavian artery stenosis. Right carotid system: CCA and ICA patent within the neck without significant stenosis (50% or greater). Mild atherosclerotic plaque about the carotid bifurcation and within the proximal ICA. Tortuosity of the cervical ICA. Left carotid system: CCA and ICA patent within the neck without significant stenosis (50% or greater). Mild atherosclerotic plaque about the carotid bifurcation and within the proximal ICA. Vertebral arteries: Vertebral arteries codominant and patent within the neck without significant stenosis. Minimal nonstenotic calcified plaque within the proximal left vertebral artery. Skeleton: No acute bony abnormality or aggressive osseous lesion. Cervical spondylosis. Cervical levocurvature with  partially imaged thoracic dextrocurvature. Other neck: No neck mass or cervical lymphadenopathy. Upper chest: Prior median sternotomy. No consolidation within the imaged lung apices. Review of the MIP images confirms the above findings CTA HEAD FINDINGS Anterior circulation: The intracranial internal carotid arteries are patent. Calcified plaque within both vessels. No more than mild stenosis within the intracranial  right ICA. Up to moderate stenosis within the cavernous left ICA. The M1 middle cerebral arteries are patent. No M2 proximal branch occlusion is identified. Atherosclerotic irregularity of the M2 and more distal MCA vessels, bilaterally. Most notably, there is a site of severe stenosis within a mid M2 right MCA vessel (series 12, image 8) and a site of apparent severe stenosis within a superior division proximal M2 left MCA vessel (series 12, image 23). The anterior cerebral arteries are patent. No intracranial aneurysm is identified. Posterior circulation: The intracranial vertebral arteries are patent. Calcified plaque within the intracranial vertebral arteries, bilaterally with no more than mild stenosis. The basilar artery is patent. The posterior cerebral arteries are patent. A small right posterior communicating artery is present. The left posterior communicating artery is diminutive or absent. Venous sinuses: Within the limitations of contrast timing, no convincing thrombus. Anatomic variants: As described. Review of the MIP images confirms the above findings No emergent large vessel occlusion identified. These results were communicated to Dr. Jefferey Pica 3/4/2023by text page via the [facility] messaging system. IMPRESSION: CTA neck: 1. The common carotid, internal carotid and vertebral arteries are patent within the neck without hemodynamically significant stenosis. Mild atherosclerotic plaque about the carotid bifurcations and within the proximal ICAs, and also within the proximal left vertebral  artery. 2.  Aortic Atherosclerosis (ICD10-I70.0). CTA head: 1. No intracranial large vessel occlusion is identified. 2. Intracranial atherosclerotic disease with multifocal stenoses, most notably as follows. 3. Up to moderate stenosis within the cavernous left ICA. 4. Site of severe stenosis within a mid M2 right MCA vessel. 5. Site of severe stenosis within a proximal M2 left MCA vessel. Electronically Signed   By: Kellie Simmering D.O.   On: 11/03/2021 10:47     PHYSICAL EXAM  Temp:  [98.3 F (36.8 C)-99.1 F (37.3 C)] 98.3 F (36.8 C) (03/05 0756) Pulse Rate:  [65-120] 70 (03/05 1300) Resp:  [8-35] 17 (03/05 1300) BP: (103-208)/(55-121) 118/58 (03/05 1300) SpO2:  [90 %-98 %] 96 % (03/05 1300)  General - Well nourished, well developed, in no apparent distress.  Ophthalmologic - fundi not visualized due to noncooperation.  Cardiovascular - Regular rhythm and rate.  Mental Status -  Level of arousal and orientation to time, place, and person were intact. Language including expression, naming, repetition, comprehension was assessed and found intact. Fund of Knowledge was assessed and was intact.  Cranial Nerves II - XII - II - Visual field intact OU. III, IV, VI - Extraocular movements intact. V - Facial sensation intact bilaterally. VII - Facial movement intact bilaterally. VIII - Hearing & vestibular intact bilaterally. X - Palate elevates symmetrically. XI - Chin turning & shoulder shrug intact bilaterally. XII - Tongue protrusion intact.  Motor Strength - The patients strength was normal in all extremities and pronator drift was absent.  Bulk was normal and fasciculations were absent.   Motor Tone - Muscle tone was assessed at the neck and appendages and was normal.  Reflexes - The patients reflexes were symmetrical in all extremities and he had no pathological reflexes.  Sensory - Light touch, temperature/pinprick were assessed and were symmetrical.    Coordination - The  patient had normal movements in the hands and feet with no ataxia or dysmetria.  Tremor was absent.  Gait and Station - deferred.   ASSESSMENT/PLAN Franklin Calderon is a 66 y.o. male with history of hypertension, hyperlipidemia, diabetes, CAD status post CABG in 2016 admitted for right hand numbness, clumsiness, right facial  droop and aphasia.  Initially symptoms largely resolved in ER, TNK was on hold.  However later patient aphasia getting worse, status post TNK    Suspected stroke status post TNK  TIA due to intracranial stenosis CT no acute abnormality x 2 CTA head and neck intracranial stenosis with bilateral M2 severe stenosis MRI no acute infarct MRA head intracranial atherosclerosis with no hemodynamically significant stenosis on intracranial MRA 2D Echo pending LDL 182 HgbA1c 6.4 SCDs for VTE prophylaxis aspirin 81 mg daily prior to admission, now on aspirin 325 mg daily and clopidogrel 75 mg daily for 3 months and then Plavix alone given bilateral M2 stenosis. Patient counseled to be compliant with his antithrombotic medications Ongoing aggressive stroke risk factor management Therapy recommendations: Pending Disposition: Pending  Diabetes HgbA1c 6.4 goal < 7.0 Controlled CBG monitoring SSI DM education and close PCP follow up  Hypertension High on presentation, not much stabilized Long term BP goal normotensive  Orthostatic hypotension Per OT, BP taken after activity: sitting 108/54; standing 87/68 (pt unable to remain standing for pressure), after 1 min in sitting 125/65.  Recurrent aphasia when transfer from chair back to bed, resolved after lying down.  Put on TED hose Close BP monitoring Slow positioning changes PT/OT following  Hyperlipidemia Home meds: Lipitor 20 LDL 182, goal < 70 Now on Lipitor 80 Continue statin at discharge  Other Stroke Risk Factors Advanced age Obesity, Body mass index is 37.01 kg/m.  Coronary artery disease status post  CABG in 2016  Other Active Problems Episodes of agitation, combative, delirium - concerning related to Benadryl, close monitoring  Hospital day # 1  This patient is critically ill due to stroke symptoms status post TNK, hypertensive emergency, orthostatic hypotension, and at significant risk of neurological worsening, death form recurrent stroke, hemorrhagic transformation, bleeding from TIA, hypertensive encephalopathy. This patient's care requires constant monitoring of vital signs, hemodynamics, respiratory and cardiac monitoring, review of multiple databases, neurological assessment, discussion with family, other specialists and medical decision making of high complexity. I spent 45 minutes of neurocritical care time in the care of this patient. I had long discussion with wife and the patient.  At bedside, updated pt current condition, treatment plan and potential prognosis, and answered all the questions.  They expressed understanding and appreciation.      Rosalin Hawking, MD PhD Stroke Neurology 11/04/2021 1:56 PM    To contact Stroke Continuity provider, please refer to [email or web address] After hours, contact General Neurology

## 2021-11-04 NOTE — Evaluation (Addendum)
Physical Therapy Evaluation ?Patient Details ?Name: Franklin Calderon ?MRN: [id number] ?DOB: 1956/04/17 ?Today's Date: 11/04/2021 ? ?History of Present Illness ? 66 yo admitted as code stroke due to aphasia, R facial droop and  R hand numbness. Symptoms worsened in ED and pt given tNK. 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 ?  ?Clinical Impression ? Pt admitted with above diagnosis. PTA pt independent, living at home with his wife. He required min guard assist transfers, and min guard to min assist ambulation 10' x 2 without AD. Pt assisted into bathroom to urinate. While standing at toilet, pt began to sway with bilat knee buckling noted. Pt assisted out of bathroom to recliner. BP after sitting in recliner 108/54. Pt then stood and BP taken, 87/68. Returned to recliner and feet elevated. After 1-2 minutes, BP 125/65. Pt very lethargic falling asleep immediately. He was very conversant at beginning of session. RN aware. Pt currently with functional limitations due to the deficits listed below (see PT Problem List). Pt will benefit from skilled PT to increase their independence and safety with mobility to allow discharge home. Suspect pt's lethargy and unsteadiness on his feet is related to multiple sedation meds given overnight as well as NPO status. PT to follow acutely but do not foresee follow up services being indicated.   ?   ?   ? ?Recommendations for follow up therapy are one component of a multi-disciplinary discharge planning process, led by the attending physician.  Recommendations may be updated based on patient status, additional functional criteria and insurance authorization. ? ?Follow Up Recommendations No PT follow up ? ?  ?Assistance Recommended at Discharge Intermittent  Supervision/Assistance  ?Patient can return home with the following ?   ? ?  ?Equipment Recommendations None recommended by PT  ?Recommendations for Other Services ?    ?  ?Functional Status Assessment Patient has had a recent decline in their functional status and demonstrates the ability to make significant improvements in function in a reasonable and predictable amount of time.  ? ?  ?Precautions / Restrictions Precautions ?Precautions: Fall;Other (comment) ?Precaution Comments: orthostatic; watch BP  ? ?  ? ?Mobility ? Bed Mobility ?Overal bed mobility: Modified Independent ?  ?  ?  ?  ?  ?  ?General bed mobility comments: +rail, HOB elevated ?  ? ?Transfers ?Overall transfer level: Needs assistance ?Equipment used: 2 person hand held assist ?Transfers: Sit to/from Stand ?Sit to Stand: Min guard ?  ?  ?  ?  ?  ?  ?  ? ?Ambulation/Gait ?Ambulation/Gait assistance: +2 safety/equipment, Min guard, Min assist ?Gait Distance (Feet): 10 Feet (x 2) ?Assistive device: 2 person hand held assist, 1 person hand held assist, None ?Gait Pattern/deviations: Step-through pattern, Decreased stride length, Drifts right/left ?  ?  ?  ?General Gait Details: initially amb to bathroom without UE support. Required +1 HHA then progressed to +2 HHA for ambulation bathroom to recliner due to increasing instability. ? ?Stairs ?  ?  ?  ?  ?  ? ?Wheelchair Mobility ?  ? ?Modified Rankin (Stroke Patients Only) ?  ? ?  ? ?Balance Overall balance assessment: Needs assistance ?  ?Sitting balance-Leahy Scale: Fair ?  ?  ?Standing balance support: During functional activity, Bilateral upper extremity supported, No upper extremity supported ?Standing  balance-Leahy Scale: Poor ?  ?  ?  ?  ?  ?  ?  ?  ?  ?  ?  ?  ?   ? ? ? ?Pertinent Vitals/Pain Pain Assessment ?Pain Assessment: No/denies pain  ? ? ?Home Living Family/patient expects to be discharged to:: Private residence ?Living Arrangements: Spouse/significant other ?Available Help at Discharge:  Family;Available 24 hours/day ?Type of Home: House ?Home Access: Stairs to enter ?Entrance Stairs-Rails: None ?Entrance Stairs-Number of Steps: 2 ?  ?Home Layout: Two level;Bed/bath upstairs;1/2 bath on main level ?Home Equipment: None ?   ?  ?Prior Function Prior Level of Function : Independent/Modified Independent;Driving ?  ?  ?  ?  ?  ?  ?  ?  ?  ? ? ?Hand Dominance  ? Dominant Hand: Right ? ?  ?Extremity/Trunk Assessment  ? Upper Extremity Assessment ?Upper Extremity Assessment: Overall WFL for tasks assessed ?  ? ?Lower Extremity Assessment ?Lower Extremity Assessment: Overall WFL for tasks assessed ?  ? ?Cervical / Trunk Assessment ?Cervical / Trunk Assessment: Normal  ?Communication  ? Communication: No difficulties  ?Cognition Arousal/Alertness: Suspect due to medications, Lethargic ?Behavior During Therapy: Flat affect ?Overall Cognitive Status: Impaired/Different from baseline ?Area of Impairment: Attention, Awareness, Memory ?  ?  ?  ?  ?  ?  ?  ?  ?  ?Current Attention Level: Selective ?Memory: Decreased short-term memory ?  ?  ?Awareness: Emergent ?  ?General Comments: Pt had several sedating meds; wife reports returning to baseline; will further assess ?  ?  ? ?  ?General Comments General comments (skin integrity, edema, etc.): wife present for session ? ?  ?Exercises    ? ?Assessment/Plan  ?  ?PT Assessment Patient needs continued PT services  ?PT Problem List Decreased mobility;Decreased safety awareness;Decreased knowledge of precautions;Decreased activity tolerance;Decreased balance;Cardiopulmonary status limiting activity ? ?   ?  ?PT Treatment Interventions Therapeutic activities;DME instruction;Gait training;Therapeutic exercise;Cognitive remediation;Patient/family education;Balance training;Stair training;Functional mobility training   ? ?PT Goals (Current goals can be found in the Care Plan section)  ?Acute Rehab PT Goals ?Patient Stated Goal: home ?PT Goal Formulation: With  patient/family ?Time For Goal Achievement: 11/18/21 ?Potential to Achieve Goals: Good ? ?  ?Frequency Min 4X/week ?  ? ? ?Co-evaluation   ?Reason for Co-Treatment: For patient/therapist safety ?  ?OT goals addressed during session: ADL's and self-care ?  ? ? ?  ?AM-PAC PT "6 Clicks" Mobility  ?Outcome Measure Help needed turning from your back to your side while in a flat bed without using bedrails?: None ?Help needed moving from lying on your back to sitting on the side of a flat bed without using bedrails?: None ?Help needed moving to and from a bed to a chair (including a wheelchair)?: A Little ?Help needed standing up from a chair using your arms (e.g., wheelchair or bedside chair)?: A Little ?Help needed to walk in hospital room?: A Little ?Help needed climbing 3-5 steps with a railing? : A Lot ?6 Click Score: 19 ? ?  ?End of Session Equipment Utilized During Treatment: Gait belt ?Activity Tolerance: Treatment limited secondary to medical complications (Comment) (orthostatic) ?Patient left: in chair;with call bell/phone within reach;with chair alarm set;with family/visitor present ?Nurse Communication: Mobility status;Other (comment) (+orthostatics) ?PT Visit Diagnosis: Unsteadiness on feet (R26.81);Difficulty in walking, not elsewhere classified (R26.2) ?  ? ?Time: 4888-9169 ?PT Time Calculation (min) (ACUTE ONLY): 25 min ? ? ?Charges:   PT Evaluation ?$PT Eval Moderate Complexity: 1 Mod ?  ?  ?   ? ? ?  Aida Raider, PT  ?Office # [phone] ?Pager [phone] ? ? ?Ilda Foil ?11/04/2021, 2:37 PM ? ?

## 2021-11-04 NOTE — Evaluation (Signed)
Occupational Therapy Evaluation ?Patient Details ?Name: Franklin Calderon ?MRN: [id number] ?DOB: 05-10-1956 ?Today's Date: 11/04/2021 ? ? ?History of Present Illness 66 yo admitted as code stroke due to aphasia, R facial droop and  R hand numbness. Symptoms worsened in ED and pt given tNK. PMH significant for DM2, HTN, HLD, CAD s/p CABG in 2016.  ? ?Clinical Impression ?  ?PTA pt lives independently with his wife. Pt falt and lethargic, however most likely related to sedating meds. Able to ambulate to the bathroom with +2 HHA for safety, however while standing at toilet, pt began to sway, became shaky with B knees buckling. Ambulated back to chair. BP taken after activity: sitting 108/54; standing 87/68 (pt unable to remain standing for pressure), after 1 min in sitting 125/65. Nsg made aware. Feel pt will progress and not need OT follow up. Will further assess as level of arousal improves.  ?   ? ?Recommendations for follow up therapy are one component of a multi-disciplinary discharge planning process, led by the attending physician.  Recommendations may be updated based on patient status, additional functional criteria and insurance authorization.  ? ?Follow Up Recommendations ? No OT follow up  ?  ?Assistance Recommended at Discharge Intermittent Supervision/Assistance  ?Patient can return home with the following A little help with bathing/dressing/bathroom;Direct supervision/assist for medications management;Direct supervision/assist for financial management;Assist for transportation ? ?  ?Functional Status Assessment ? Patient has had a recent decline in their functional status and demonstrates the ability to make significant improvements in function in a reasonable and predictable amount of time.  ?Equipment Recommendations ? None recommended by OT  ?  ?Recommendations for Other Services   ? ? ?  ?Precautions / Restrictions Precautions ?Precautions: Fall ?Precaution Comments: orthostatic; watch BP  ? ?  ? ?Mobility  Bed Mobility ?Overal bed mobility: Modified Independent ?  ?  ?  ?  ?  ?  ?  ?  ? ?Transfers ?Overall transfer level: Needs assistance ?Equipment used: 2 person hand held assist ?Transfers: Sit to/from Stand ?Sit to Stand: Min guard ?  ?  ?  ?  ?  ?  ?  ? ?  ?Balance Overall balance assessment: Needs assistance ?  ?Sitting balance-Leahy Scale: Fair ?  ?  ?  ?Standing balance-Leahy Scale: Poor ?Standing balance comment: support required ?  ?  ?  ?  ?  ?  ?  ?  ?  ?  ?  ?   ? ?ADL either performed or assessed with clinical judgement  ? ?ADL Overall ADL's : Needs assistance/impaired ?  ?  ?  ?  ?  ?  ?  ?  ?  ?  ?  ?  ?  ?  ?  ?  ?  ?  ?Functional mobility during ADLs: Minimal assistance;+2 for safety/equipment ?General ADL Comments: Overall minguard A for ADL tasks at this time primarily due to orthostasis; Pt abl eto ambulate into bathroom to urinate in standing position however most likley becoming orthostatis and began swaying with B knee buckling, pt grabbed onto rail on side, then ambulated back to bed with +2 assist  ? ? ? ?Vision Baseline Vision/History: 1 Wears glasses (for reading) ?Additional Comments: most likely at baseline  ?   ?Perception Perception ?Comments: appears WFL ?  ?Praxis   ?  ? ?Pertinent Vitals/Pain Pain Assessment ?Pain Assessment: No/denies pain  ? ? ? ?Hand Dominance Right ?  ?Extremity/Trunk Assessment Upper Extremity Assessment ?Upper Extremity Assessment: Overall WFL for  tasks assessed ?  ?Lower Extremity Assessment ?Lower Extremity Assessment: Defer to PT evaluation ?  ?Cervical / Trunk Assessment ?Cervical / Trunk Assessment: Normal ?  ?Communication Communication ?Communication: No difficulties ?  ?Cognition Arousal/Alertness: Suspect due to medications, Lethargic ?Behavior During Therapy: Flat affect ?Overall Cognitive Status: Impaired/Different from baseline ?Area of Impairment: Attention, Awareness, Memory ?  ?  ?  ?  ?  ?  ?  ?  ?  ?Current Attention Level: Selective ?Memory:  Decreased short-term memory ?  ?  ?Awareness: Emergent ?  ?General Comments: Pt had several sedating meds; wife reports returning to baseline; will further assess ?  ?  ?General Comments  wife present for session ? ?  ?Exercises   ?  ?Shoulder Instructions    ? ? ?Home Living Family/patient expects to be discharged to:: Private residence ?Living Arrangements: Spouse/significant other ?Available Help at Discharge: Family;Available 24 hours/day ?Type of Home: House ?Home Access: Stairs to enter ?Entrance Stairs-Number of Steps: 2 ?Entrance Stairs-Rails: None ?Home Layout: Two level;Bed/bath upstairs;1/2 bath on main level ?  ?  ?Bathroom Shower/Tub: Walk-in shower ?  ?Bathroom Toilet: Standard ?Bathroom Accessibility: Yes ?How Accessible: Accessible via walker ?Home Equipment: None ?  ?  ? Lives With: Spouse ? ?  ?Prior Functioning/Environment Prior Level of Function : Independent/Modified Independent;Driving (retired) ?  ?  ?  ?  ?  ?  ?  ?  ?  ? ?  ?  ?OT Problem List: Decreased activity tolerance;Impaired balance (sitting and/or standing);Decreased safety awareness;Decreased knowledge of use of DME or AE ?  ?   ?OT Treatment/Interventions: Self-care/ADL training;Therapeutic exercise;Neuromuscular education;DME and/or AE instruction;Therapeutic activities;Patient/family education;Cognitive remediation/compensation  ?  ?OT Goals(Current goals can be found in the care plan section) Acute Rehab OT Goals ?Patient Stated Goal: to feel better ?OT Goal Formulation: With patient/family ?Time For Goal Achievement: 11/18/21 ?Potential to Achieve Goals: Good  ?OT Frequency: Min 2X/week ?  ? ?Co-evaluation PT/OT/SLP Co-Evaluation/Treatment: Yes ?Reason for Co-Treatment: For patient/therapist safety ?  ?OT goals addressed during session: ADL's and self-care ?  ? ?  ?AM-PAC OT "6 Clicks" Daily Activity     ?Outcome Measure Help from another person eating meals?: A Little ?Help from another person taking care of personal  grooming?: A Little ?Help from another person toileting, which includes using toliet, bedpan, or urinal?: A Little ?Help from another person bathing (including washing, rinsing, drying)?: A Little ?Help from another person to put on and taking off regular upper body clothing?: A Little ?Help from another person to put on and taking off regular lower body clothing?: A Little ?6 Click Score: 18 ?  ?End of Session Equipment Utilized During Treatment: Gait belt ?Nurse Communication: Mobility status;Other (comment) (BP) ? ?Activity Tolerance: Other (comment);Treatment limited secondary to medical complications (Comment) (pt limited by orthostasis) ?Patient left: in chair;with call bell/phone within reach;with chair alarm set;with family/visitor present ? ?OT Visit Diagnosis: Unsteadiness on feet (R26.81);Other symptoms and signs involving the nervous system (R29.898);Other symptoms and signs involving cognitive function;Dizziness and giddiness (R42)  ?              ?Time: [id number] ?OT Time Calculation (min): 25 min ?Charges:  OT General Charges ?$OT Visit: 1 Visit ?OT Evaluation ?$OT Eval Moderate Complexity: 1 Mod ? ?[facility], OT/L  ? ?Acute OT Clinical Specialist ?Acute Rehabilitation Services ?Pager [phone] ?Office [phone]  ? ?Lorine Iannaccone,HILLARY ?11/04/2021, 1:14 PM ?

## 2021-11-05 DIAGNOSIS — I674 Hypertensive encephalopathy: Secondary | ICD-10-CM

## 2021-11-05 LAB — CBC
HCT: 44.6 % (ref 39.0–52.0)
Hemoglobin: 14.9 g/dL (ref 13.0–17.0)
MCH: 31 pg (ref 26.0–34.0)
MCHC: 33.4 g/dL (ref 30.0–36.0)
MCV: 92.7 fL (ref 80.0–100.0)
Platelets: 225 10*3/uL (ref 150–400)
RBC: 4.81 MIL/uL (ref 4.22–5.81)
RDW: 13.3 % (ref 11.5–15.5)
WBC: 12.6 10*3/uL — ABNORMAL HIGH (ref 4.0–10.5)
nRBC: 0 % (ref 0.0–0.2)

## 2021-11-05 LAB — GLUCOSE, CAPILLARY
Glucose-Capillary: 167 mg/dL — ABNORMAL HIGH (ref 70–99)
Glucose-Capillary: 200 mg/dL — ABNORMAL HIGH (ref 70–99)
Glucose-Capillary: 217 mg/dL — ABNORMAL HIGH (ref 70–99)
Glucose-Capillary: 214 mg/dL — ABNORMAL HIGH (ref 70–99)

## 2021-11-05 LAB — BASIC METABOLIC PANEL
Anion gap: 11 (ref 5–15)
BUN: 22 mg/dL (ref 8–23)
CO2: 24 mmol/L (ref 22–32)
Calcium: 8.6 mg/dL — ABNORMAL LOW (ref 8.9–10.3)
Chloride: 95 mmol/L — ABNORMAL LOW (ref 98–111)
Creatinine, Ser: 0.98 mg/dL (ref 0.61–1.24)
GFR, Estimated: >60 mL/min (ref >60)
Glucose, Bld: 143 mg/dL — ABNORMAL HIGH (ref 70–99)
Potassium: 3.6 mmol/L (ref 3.5–5.1)
Sodium: 130 mmol/L — ABNORMAL LOW (ref 135–145)

## 2021-11-05 MED ORDER — LABETALOL HCL 5 MG/ML IV SOLN: 5.0000 mg | INTRAVENOUS | Status: DC | PRN

## 2021-11-05 MED ORDER — AMLODIPINE BESYLATE 10 MG PO TABS
10.0000 mg | ORAL_TABLET | Freq: Every day | ORAL | Status: DC
  Administered 2021-11-05 – 2021-11-06 (×2): 10 mg via ORAL
  Filled 2021-11-05 (×2): qty 1

## 2021-11-05 MED ORDER — METOPROLOL SUCCINATE ER 50 MG PO TB24
100.0000 mg | ORAL_TABLET | Freq: Every day | ORAL | Status: DC
Start: 2021-11-05 — End: 2021-11-06
  Administered 2021-11-05 – 2021-11-06 (×2): 100 mg via ORAL
  Filled 2021-11-05 (×2): qty 2

## 2021-11-05 MED ORDER — POTASSIUM CHLORIDE CRYS ER 20 MEQ PO TBCR
40.0000 meq | EXTENDED_RELEASE_TABLET | Freq: Once | ORAL | Status: AC
  Administered 2021-11-05: 40 meq via ORAL
  Filled 2021-11-05: qty 2

## 2021-11-05 MED ORDER — IRBESARTAN 150 MG PO TABS
300.0000 mg | ORAL_TABLET | Freq: Every day | ORAL | Status: DC
  Administered 2021-11-05 – 2021-11-06 (×2): 300 mg via ORAL
  Filled 2021-11-05 (×3): qty 2

## 2021-11-05 NOTE — Progress Notes (Signed)
?  Transition of Care (TOC) Screening Note ? ? ?Patient Details  ?Name: Franklin Calderon ?Date of Birth: December 14, 1955 ? ? ?Transition of Care (TOC) CM/SW Contact:    ?Mearl Latin, LCSW ?Phone Number: ?11/05/2021, 2:53 PM ? ? ? ?Transition of Care Department [facility]) has reviewed patient and no TOC needs have been identified at this time. We will continue to monitor patient advancement through interdisciplinary progression rounds. If new patient transition needs arise, please place a TOC consult. ? ? ?

## 2021-11-05 NOTE — TOC CAGE-AID Note (Signed)
Transition of Care (TOC) - CAGE-AID Screening ? ? ?Patient Details  ?Name: Franklin Calderon ?MRN: [id number] ?Date of Birth: 06/15/1956 ? ?Transition of Care (TOC) CM/SW Contact:    ?Macayla Ekdahl C Tarpley-Carter, LCSWA ?Phone Number: ?11/05/2021, 12:42 PM ? ? ?Clinical Narrative: ?Pt participated in Cage-Aid.  Pt stated he does not use substance or ETOH.  Pt was not offered resources, due to no usage of substance or ETOH.   ? ?Insurance underwriter, MSW, LCSW-A ?Pronouns:  She/Her/Hers ?[facility] ?Clinical Social Worker ?Direct Number:  [phone] ?[email or web address] ? ? ? ?CAGE-AID Screening: ?  ? ?Have You Ever Felt You Ought to Cut Down on Your Drinking or Drug Use?: No ?Have People Annoyed You By Critizing Your Drinking Or Drug Use?: No ?Have You Felt Bad Or Guilty About Your Drinking Or Drug Use?: No ?Have You Ever Had a Drink or Used Drugs First Thing In The Morning to Steady Your Nerves or to Get Rid of a Hangover?: No ?CAGE-AID Score: 0 ? ?Substance Abuse Education Offered: No ? ?  ? ? ? ? ? ? ?

## 2021-11-05 NOTE — Progress Notes (Signed)
Physical Therapy Treatment ?Patient Details ?Name: Franklin Calderon ?MRN: [id number] ?DOB: May 20, 1956 ?Today's Date: 11/05/2021 ? ? ?History of Present Illness 66 yo admitted as code stroke due to aphasia, R facial droop and  R hand numbness. Symptoms worsened in ED and pt given tNK. 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 ? ?  ?PT Comments  ? ? Pt showing improvement with mobility. Relays that he has peripheral neuropathy and has had mild balance issues at baseline. Had one LOB with initial mobility, self corrected with stepping strategy. Became more stable the further he walked. Worked on bkwd walking and quick changes in direction with no LOB. Pt traversed a flight of stairs with use of rail, safely with supervision. Will follow acutely and discussed pt transitioning to community based exercise program at d/c. PT will continue to follow. ?  ?Recommendations for follow up therapy are one component of a multi-disciplinary discharge planning process, led by the attending physician.  Recommendations may be updated based on patient status, additional functional criteria and insurance authorization. ? ?Follow Up Recommendations ? No PT follow up ?  ?  ?Assistance Recommended at Discharge Intermittent Supervision/Assistance  ?Patient can return home with the following   ?  ?Equipment Recommendations ? None recommended by PT  ?  ?Recommendations for Other Services   ? ? ?  ?Precautions / Restrictions Precautions ?Precautions: Fall ?Precaution Comments: watch BP ?Restrictions ?Weight Bearing Restrictions: No  ?  ? ?Mobility ? Bed Mobility ?Overal bed mobility: Modified Independent ?  ?  ?  ?  ?  ?  ?  ?  ? ?Transfers ?Overall transfer level: Needs assistance ?Equipment used: None ?Transfers: Sit to/from  Stand ?Sit to Stand: Min guard ?  ?  ?  ?  ?  ?General transfer comment: pt took large compensatory step to R with initial standing, gained balance on his own but close guarding given ?  ? ?Ambulation/Gait ?Ambulation/Gait assistance: Min guard ?Gait Distance (Feet): 400 Feet ?Assistive device: None ?Gait Pattern/deviations: Step-through pattern, Drifts right/left ?Gait velocity: decreased, then normal ?Gait velocity interpretation: >2.62 ft/sec, indicative of community ambulatory ?  ?General Gait Details: worked on changing directions, bkwd walking, and making quick corrections. Pt became more steady the longer he was up ? ? ?Stairs ?Stairs: Yes ?Stairs assistance: Supervision ?Stair Management: One rail Right, Alternating pattern, Forwards ?Number of Stairs: 10 ?General stair comments: no difficulty with use of rail ? ? ?Wheelchair Mobility ?  ? ?Modified Rankin (Stroke Patients Only) ?Modified Rankin (Stroke Patients Only) ?Pre-Morbid Rankin Score: No symptoms ?Modified Rankin: Slight disability ? ? ?  ?Balance Overall balance assessment: Needs assistance ?  ?Sitting balance-Leahy Scale: Good ?  ?  ?Standing balance support: During functional activity, Bilateral upper extremity supported, No upper extremity supported ?Standing balance-Leahy Scale: Fair ?Standing balance comment: worked on dynamic balance without UE support and min-guard A ?  ?  ?  ?  ?  ?  ?  ?  ?Standardized Balance Assessment ?Standardized Balance Assessment : Berg Balance Test ?Sharlene Motts Balance Test ?Sit to Stand: Able to stand without using hands and stabilize independently ?Standing Unsupported: Able to stand safely 2 minutes ?Sitting with Back Unsupported but Feet Supported on Floor or Stool: Able to sit safely and securely 2 minutes ?Stand to Sit: Sits safely with minimal  use of hands ?Transfers: Able to transfer safely, minor use of hands ?Standing Unsupported with Eyes Closed: Able to stand 10 seconds safely ?Standing Ubsupported with Feet  Together: Needs help to attain position and unable to hold for 15 seconds ?From Standing, Reach Forward with Outstretched Arm: Can reach forward >12 cm safely (5") ?From Standing Position, Pick up Object from Floor: Able to pick up shoe safely and easily ?From Standing Position, Turn to Look Behind Over each Shoulder: Looks behind from both sides and weight shifts well ?Turn 360 Degrees: Able to turn 360 degrees safely in 4 seconds or less ?Standing Unsupported, Alternately Place Feet on Step/Stool: Able to complete 4 steps without aid or supervision ?Standing Unsupported, One Foot in Front: Needs help to step but can hold 15 seconds ?Standing on One Leg: Tries to lift leg/unable to hold 3 seconds but remains standing independently ?Total Score: 43 ?  ?  ? ?  ?Cognition Arousal/Alertness: Awake/alert ?Behavior During Therapy: [facility] for tasks assessed/performed ?Overall Cognitive Status: Within Functional Limits for tasks assessed ?  ?  ?  ?  ?  ?  ?  ?  ?  ?  ?  ?  ?  ?  ?  ?  ?General Comments: pt appropriate during treatment, able to give history and explain limitations ?  ?  ? ?  ?Exercises   ? ?  ?General Comments General comments (skin integrity, edema, etc.): Sharlene Motts shows significant fall risk. Discussed participation in community based exercise program that incorporates balance. HR 99 bpm on stairs, returned to 76 bpm after session. BP elevated today. SPO2 90's on RA. ?  ?  ? ?Pertinent Vitals/Pain Pain Assessment ?Pain Assessment: Faces ?Faces Pain Scale: Hurts a little bit ?Pain Location: head  ? ? ?Home Living   ?  ?  ?  ?  ?  ?  ?  ?  ?  ?   ?  ?Prior Function    ?  ?  ?   ? ?PT Goals (current goals can now be found in the care plan section) Acute Rehab PT Goals ?Patient Stated Goal: home ?PT Goal Formulation: With patient/family ?Time For Goal Achievement: 11/18/21 ?Potential to Achieve Goals: Good ? ?  ?Frequency ? ? ? Min 4X/week ? ? ? ?  ?PT Plan Current plan remains appropriate  ? ? ?Co-evaluation   ?  ?   ?  ?  ? ?  ?AM-PAC PT "6 Clicks" Mobility   ?Outcome Measure ? Help needed turning from your back to your side while in a flat bed without using bedrails?: None ?Help needed moving from lying on your back to sitting on the side of a flat bed without using bedrails?: None ?Help needed moving to and from a bed to a chair (including a wheelchair)?: None ?Help needed standing up from a chair using your arms (e.g., wheelchair or bedside chair)?: A Little ?Help needed to walk in hospital room?: A Little ?Help needed climbing 3-5 steps with a railing? : None ?6 Click Score: 22 ? ?  ?End of Session Equipment Utilized During Treatment: Gait belt ?Activity Tolerance: Patient tolerated treatment well ?Patient left: in chair;with call bell/phone within reach;with family/visitor present ?Nurse Communication: Mobility status ?PT Visit Diagnosis: Unsteadiness on feet (R26.81);Difficulty in walking, not elsewhere classified (R26.2) ?  ? ? ?Time: 4196-2229 ?PT Time Calculation (min) (ACUTE ONLY): 25 min ? ?Charges:  $Gait Training: 23-37 mins          ?          ? ?  Lyanne Co, PT  ?Acute Rehab Services ? Pager [phone] ?Office [phone] ? ? ? ?Sonji Starkes L Javante Nilsson ?11/05/2021, 1:20 PM ? ?

## 2021-11-05 NOTE — Progress Notes (Signed)
STROKE TEAM PROGRESS NOTE   SUBJECTIVE (INTERVAL HISTORY) His wife is at the bedside. Pt sitting in chair, no complains, wife and he felt that he is back to baseline. Neuro exam unremarkable today. However, his BP still high, will resume home meds.    OBJECTIVE Temp:  [96.8 F (36 C)-99.6 F (37.6 C)] 96.8 F (36 C) (03/06 1144) Pulse Rate:  [73-104] 73 (03/06 1400) Cardiac Rhythm: Normal sinus rhythm (03/06 0800) Resp:  [10-25] 15 (03/06 1400) BP: (124-207)/(64-128) 148/69 (03/06 1400) SpO2:  [87 %-98 %] 94 % (03/06 1400)  Recent Labs  Lab 11/04/21 1217 11/04/21 1719 11/04/21 2153 11/05/21 0744 11/05/21 1129  GLUCAP 162* 196* 163* 167* 200*   Recent Labs  Lab 11/03/21 1016 11/03/21 1017 11/04/21 1013 11/05/21 0341  NA 133* 133* 130* 130*  K 4.5 4.5 4.2 3.6  CL 100 98 97* 95*  CO2 25  --  24 24  GLUCOSE 111* 106* 173* 143*  BUN 13 15 18 22   CREATININE 0.99 0.90 1.01 0.98  CALCIUM 9.1  --  9.1 8.6*   Recent Labs  Lab 11/03/21 1016  AST 19  ALT 18  ALKPHOS 59  BILITOT 0.6  PROT 6.7  ALBUMIN 4.0   Recent Labs  Lab 11/03/21 1016 11/03/21 1017 11/04/21 1013 11/05/21 0341  WBC 8.2  --  13.9* 12.6*  NEUTROABS 3.9  --   --   --   HGB 15.9 16.7 15.2 14.9  HCT 47.1 49.0 44.6 44.6  MCV 94.2  --  92.3 92.7  PLT 258  --  227 225   No results for input(s): CKTOTAL, CKMB, CKMBINDEX, TROPONINI in the last 168 hours. Recent Labs    11/03/21 1016  LABPROT 13.8  INR 1.1   No results for input(s): COLORURINE, LABSPEC, PHURINE, GLUCOSEU, HGBUR, BILIRUBINUR, KETONESUR, PROTEINUR, UROBILINOGEN, NITRITE, LEUKOCYTESUR in the last 72 hours.  Invalid input(s): APPERANCEUR     Component Value Date/Time   CHOL 241 (H) 11/04/2021 0326   TRIG 46 11/04/2021 0326   HDL 50 11/04/2021 0326   CHOLHDL 4.8 11/04/2021 0326   VLDL 9 11/04/2021 0326   LDLCALC 182 (H) 11/04/2021 0326   Lab Results  Component Value Date   HGBA1C 6.4 (H) 11/04/2021   No results found for:  LABOPIA, COCAINSCRNUR, LABBENZ, AMPHETMU, THCU, LABBARB  No results for input(s): ETH in the last 168 hours.  I have personally reviewed the radiological images below and agree with the radiology interpretations.  CT HEAD WO CONTRAST ( )  Result Date: 11/04/2021 CLINICAL DATA:  Stroke, follow-up; vomiting, headache, speech changes. EXAM: CT HEAD WITHOUT CONTRAST TECHNIQUE: Contiguous axial images were obtained from the base of the skull through the vertex without intravenous contrast. RADIATION DOSE REDUCTION: This exam was performed according to the departmental dose-optimization program which includes automated exposure control, adjustment of the mA and/or kV according to patient size and/or use of iterative reconstruction technique. COMPARISON:  MRI brain and MRA head 11/04/2021. FINDINGS: Brain: Streak and beam hardening artifact arising from dental restoration partially obscures the posterior fossa contents. No age advanced or lobar predominant cerebral atrophy. Mild-to-moderate patchy and ill-defined hypoattenuation within the cerebral white matter, compatible with nonspecific chronic cerebral white matter disease. A known small chronic infarct within the left cerebellar hemisphere was better appreciated on the brain MRI performed earlier today. There is no acute intracranial hemorrhage. No demarcated cortical infarct. No extra-axial fluid collection. No evidence of an intracranial mass. No midline shift. Vascular: No hyperdense vessel.  Atherosclerotic calcifications. Skull: Normal. Negative for fracture or focal lesion. Sinuses/Orbits: Visualized orbits show no acute finding. No significant paranasal sinus disease. IMPRESSION: Streak and beam hardening artifact arising from dental restoration partially obscures the posterior fossa contents. No evidence of acute intracranial abnormality or appreciable change from the MRI brain performed earlier today. Electronically Signed   By: Jackey LogeKyle  Golden D.O.    On: 11/04/2021 16:08   CT HEAD WO CONTRAST (5MM)  Result Date: 11/03/2021 CLINICAL DATA:  CVA EXAM: CT HEAD WITHOUT CONTRAST TECHNIQUE: Contiguous axial images were obtained from the base of the skull through the vertex without intravenous contrast. RADIATION DOSE REDUCTION: This exam was performed according to the departmental dose-optimization program which includes automated exposure control, adjustment of the mA and/or kV according to patient size and/or use of iterative reconstruction technique. COMPARISON:  Previous study done earlier today FINDINGS: Brain: No acute intracranial findings are seen. Ventricles are not dilated. There is no shift of midline structures. Cortical sulci are prominent. Cisterna magna is prominent. Vascular: Unremarkable. Skull: Unremarkable. Sinuses/Orbits: There is mild mucosal thickening in the ethmoid sinus. Other: None IMPRESSION: No acute intracranial findings are seen in noncontrast CT brain. Atrophy. Electronically Signed   By: Ernie AvenaPalani  Rathinasamy M.D.   On: 11/03/2021 14:47   MR ANGIO HEAD WO CONTRAST  Result Date: 11/04/2021 CLINICAL DATA:  66 year old male code stroke presentation yesterday. EXAM: MRI HEAD WITHOUT CONTRAST MRA HEAD WITHOUT CONTRAST TECHNIQUE: Multiplanar, multi-echo pulse sequences of the brain and surrounding structures were acquired without intravenous contrast. Angiographic images of the Circle of Willis were acquired using MRA technique without intravenous contrast. COMPARISON:  CT head, CTA head and neck yesterday. FINDINGS: MRI HEAD FINDINGS Brain: No restricted diffusion to suggest acute infarction. No midline shift, mass effect, evidence of mass lesion, ventriculomegaly, extra-axial collection or acute intracranial hemorrhage. Cervicomedullary junction and pituitary are within normal limits. Mega cisterna magna, normal variant. Scattered mild to moderate for age, occasionally patchy bilateral cerebral white matter T2 and FLAIR hyperintensity.  And on T2* imaging numerous chronic microhemorrhages scattered throughout the brain, including some suspected within the brainstem. Cerebellum might be spared. No cortical encephalomalacia identified. No blood or debris within the ventricular system. No T1 or T2 signal changes within the deep gray matter nuclei. But there is a small chronic left cerebellar linear infarct on series 9, image 9. Vascular: Major intracranial vascular flow voids are preserved. Skull and upper cervical spine: Negative. Visualized bone marrow signal is within normal limits. Sinuses/Orbits: Postoperative changes to both globes. Paranasal Visualized paranasal sinuses and mastoids are stable and well aerated. Other: Visible internal auditory structures appear normal. Negative visible scalp and face. MRA HEAD FINDINGS Anterior circulation: Antegrade flow in both ICA siphons. Bilateral siphon irregularity in keeping with atherosclerosis, maximal in the left supraclinoid segment although only mild stenosis results. Patent carotid termini. Normal ophthalmic artery origins. Normal MCA and ACA origins. Mildly dominant left A1. Normal anterior communicating artery. Patent MCA M1 segments and bifurcations without stenosis. Visible bilateral MCA and ACA branches are within normal limits. Posterior circulation: Antegrade flow in the posterior circulation with codominant distal vertebral arteries. Normal right PICA origin. No distal vertebral or vertebrobasilar junction stenosis. Patent basilar artery without stenosis. Normal SCA and PCA origins. Posterior communicating arteries are diminutive or absent. Bilateral PCA branches are within normal limits. Other findings: Occasional chronic microhemorrhage related susceptibility is visible. Anatomic variants: Mildly dominant left ACA A1. IMPRESSION: 1. No acute intracranial abnormality. 2. Mild to moderate for age nonspecific cerebral  white matter signal changes with a small chronic infarct in the left  cerebellum. Multiple chronic micro-hemorrhages scattered in the brain. Given their extent on T2* imaging Amyloid Angiopathy is difficult to exclude. Follow-up SWI imaging may be valuable. 3. Intracranial atherosclerosis with no hemodynamically significant stenosis on intracranial MRA. Electronically Signed   By: Odessa FlemingH  Hall M.D.   On: 11/04/2021 12:18   MR BRAIN WO CONTRAST  Result Date: 11/04/2021 CLINICAL DATA:  66 year old male code stroke presentation yesterday. EXAM: MRI HEAD WITHOUT CONTRAST MRA HEAD WITHOUT CONTRAST TECHNIQUE: Multiplanar, multi-echo pulse sequences of the brain and surrounding structures were acquired without intravenous contrast. Angiographic images of the Circle of Willis were acquired using MRA technique without intravenous contrast. COMPARISON:  CT head, CTA head and neck yesterday. FINDINGS: MRI HEAD FINDINGS Brain: No restricted diffusion to suggest acute infarction. No midline shift, mass effect, evidence of mass lesion, ventriculomegaly, extra-axial collection or acute intracranial hemorrhage. Cervicomedullary junction and pituitary are within normal limits. Mega cisterna magna, normal variant. Scattered mild to moderate for age, occasionally patchy bilateral cerebral white matter T2 and FLAIR hyperintensity. And on T2* imaging numerous chronic microhemorrhages scattered throughout the brain, including some suspected within the brainstem. Cerebellum might be spared. No cortical encephalomalacia identified. No blood or debris within the ventricular system. No T1 or T2 signal changes within the deep gray matter nuclei. But there is a small chronic left cerebellar linear infarct on series 9, image 9. Vascular: Major intracranial vascular flow voids are preserved. Skull and upper cervical spine: Negative. Visualized bone marrow signal is within normal limits. Sinuses/Orbits: Postoperative changes to both globes. Paranasal Visualized paranasal sinuses and mastoids are stable and well  aerated. Other: Visible internal auditory structures appear normal. Negative visible scalp and face. MRA HEAD FINDINGS Anterior circulation: Antegrade flow in both ICA siphons. Bilateral siphon irregularity in keeping with atherosclerosis, maximal in the left supraclinoid segment although only mild stenosis results. Patent carotid termini. Normal ophthalmic artery origins. Normal MCA and ACA origins. Mildly dominant left A1. Normal anterior communicating artery. Patent MCA M1 segments and bifurcations without stenosis. Visible bilateral MCA and ACA branches are within normal limits. Posterior circulation: Antegrade flow in the posterior circulation with codominant distal vertebral arteries. Normal right PICA origin. No distal vertebral or vertebrobasilar junction stenosis. Patent basilar artery without stenosis. Normal SCA and PCA origins. Posterior communicating arteries are diminutive or absent. Bilateral PCA branches are within normal limits. Other findings: Occasional chronic microhemorrhage related susceptibility is visible. Anatomic variants: Mildly dominant left ACA A1. IMPRESSION: 1. No acute intracranial abnormality. 2. 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 Multiple chronic micro-hemorrhages scattered in the brain. Given their extent on T2* imaging Amyloid Angiopathy is difficult to exclude. Follow-up SWI imaging may be valuable. 3. Intracranial atherosclerosis with no hemodynamically significant stenosis on intracranial MRA. Electronically Signed   By: Odessa FlemingH  Hall M.D.   On: 11/04/2021 12:18   EEG adult  Result Date: 11/03/2021 Jefferson FuelStack, Colleen M, MD     11/03/2021  5:30 PM Routine EEG Report Tillman SersRobert Kanady is a 66 y.o. male with a history of stroke who is undergoing an EEG to evaluate for seizures. Report: This EEG was acquired with electrodes placed according to the International 10-20 electrode system (including Fp1,  Fp2, F3, F4, C3, C4, P3, P4, O1, O2, T3, T4, T5, T6, A1, A2, Fz, Cz, Pz). The following electrodes were missing or displaced: none. There was no  clear waking rhythm. The majority of the initial recording exhibited a background in the delta range approximately 3 Hz with brief interludes of alpha range activity. This activity was reactive to stimulation. Towards the end of the recording more of the background activity was in the alpha range again approximately at 8 Hz. There were occasional sleep spindles noted. There was no focal slowing. There were no interictal epileptiform discharges. There were no electrographic seizures identified. Photic stimulation and hyperventilation were not performed. Impression and clinical correlation: This EEG was obtained while sedated on precedex and is abnormal due to intermittent moderate to severe diffuse slowing indicative of global cerebral dysfunction indicative of global cerebral dysfunction, medication effect, or both. Epileptiform abnormalities were not seen during this recording. Bing Neighbors, MD [facility] [phone] If 7pm- 7am, please page neurology on call as listed in AMION.   ECHOCARDIOGRAM COMPLETE  Result Date: 11/04/2021    ECHOCARDIOGRAM REPORT   Patient Name:   Franklin Calderon Date of Exam: 11/04/2021 Medical Rec #:  [id number]       Height:       68.0 in Accession #:    [id number]      Weight:       243.4 lb Date of Birth:  1956-03-05        BSA:          2.222 m Patient Age:    65 years        BP:           118/58 mmHg Patient Gender: M               HR:           80 bpm. Exam Location:  Inpatient Procedure: 2D Echo, Cardiac Doppler, Color Doppler and Intracardiac            Opacification Agent Indications:    Stroke  History:        Patient has no prior history of Echocardiogram examinations.                 CAD, Prior CABG, Aortic Valve Disease; Risk Factors:Diabetes and                 Hypertension. S/P AAA repair-procedure date 02/10/15. S/P  aortic                 valve replacement with 27 mm Medtronic Hancock II stented                 porcine valve-procedure date 02/10/15.                 Aortic Valve: bioprosthetic valve is present in the aortic                 position.  Sonographer:    Ross Ludwig RDCS (AE) Referring Phys: [id number] [facility]  Sonographer Comments: Patient is morbidly obese and suboptimal subcostal window. Image acquisition challenging due to patient body habitus. IMPRESSIONS  1. Left ventricular ejection fraction, by estimation, is 70 to 75%. The left ventricle has hyperdynamic function. The left ventricle has no regional wall motion abnormalities. There is moderate concentric left ventricular hypertrophy. Left ventricular diastolic parameters are consistent with Grade I diastolic dysfunction (impaired relaxation).  2. Right ventricular systolic function is hyperdynamic. The right ventricular size is normal.  3. Left atrial size was moderately dilated.  4. The mitral valve is normal in structure. No evidence of mitral valve regurgitation.  5. The aortic valve  has been repaired/replaced. Aortic valve regurgitation is not visualized. There is a bioprosthetic valve present in the aortic position. Aortic valve mean gradient measures 9.0 mmHg. Aortic valve Vmax measures 2.05 m/s. Comparison(s): Prior images unable to be directly viewed, comparison made by report only. FINDINGS  Left Ventricle: Left ventricular ejection fraction, by estimation, is 70 to 75%. The left ventricle has hyperdynamic function. The left ventricle has no regional wall motion abnormalities. Definity contrast agent was given IV to delineate the left ventricular endocardial borders. The left ventricular internal cavity size was normal in size. There is moderate concentric left ventricular hypertrophy. Abnormal (paradoxical) septal motion consistent with post-operative status. Left ventricular diastolic parameters are consistent with Grade I diastolic  dysfunction (impaired relaxation). Indeterminate filling pressures. Right Ventricle: The right ventricular size is normal. No increase in right ventricular wall thickness. Right ventricular systolic function is hyperdynamic. Left Atrium: Left atrial size was moderately dilated. Right Atrium: Right atrial size was normal in size. Pericardium: There is no evidence of pericardial effusion. Mitral Valve: The mitral valve is normal in structure. There is mild thickening of the mitral valve leaflet(s). There is mild calcification of the anterior mitral valve leaflet(s). No evidence of mitral valve regurgitation. MV peak gradient, 5.0 mmHg. The mean mitral valve gradient is 2.0 mmHg. Tricuspid Valve: The tricuspid valve is normal in structure. Tricuspid valve regurgitation is mild. Aortic Valve: The aortic valve has been repaired/replaced. Aortic valve regurgitation is not visualized. Aortic valve mean gradient measures 9.0 mmHg. Aortic valve peak gradient measures 16.8 mmHg. Aortic valve area, by VTI measures 4.73 cm. There is a bioprosthetic valve present in the aortic position. Pulmonic Valve: The pulmonic valve was not well visualized. Pulmonic valve regurgitation is not visualized. Aorta: The aortic root and ascending aorta are structurally normal, with no evidence of dilitation. Venous: The inferior vena cava was not well visualized. IAS/Shunts: No atrial level shunt detected by color flow Doppler.  LEFT VENTRICLE PLAX 2D LVIDd:         4.10 cm   Diastology LVIDs:         2.80 cm   LV e' medial:    6.96 cm/s LV PW:         1.10 cm   LV E/e' medial:  14.3 LV IVS:        1.40 cm   LV e' lateral:   10.40 cm/s LVOT diam:     2.70 cm   LV E/e' lateral: 9.6 LV SV:         174 LV SV Index:   78 LVOT Area:     5.73 cm  RIGHT VENTRICLE RV Basal diam:  3.20 cm RV S prime:     10.00 cm/s TAPSE (M-mode): 2.5 cm LEFT ATRIUM             Index        RIGHT ATRIUM           Index LA diam:        4.70 cm 2.12 cm/m   RA Area:      20.60 cm LA Vol (A2C):   50.0 ml 22.51 ml/m  RA Volume:   63.00 ml  28.36 ml/m LA Vol (A4C):   49.0 ml 22.06 ml/m LA Biplane Vol: 49.2 ml 22.15 ml/m  AORTIC VALVE AV Area (Vmax):    4.30 cm AV Area (Vmean):   4.17 cm AV Area (VTI):     4.73 cm AV Vmax:  205.00 cm/s AV Vmean:          140.000 cm/s AV VTI:            0.368 m AV Peak Grad:      16.8 mmHg AV Mean Grad:      9.0 mmHg LVOT Vmax:         154.00 cm/s LVOT Vmean:        102.000 cm/s LVOT VTI:          0.304 m LVOT/AV VTI ratio: 0.83  AORTA Ao Root diam: 3.50 cm Ao Asc diam:  3.10 cm MITRAL VALVE MV Area (PHT): 2.23 cm     SHUNTS MV Area VTI:   6.43 cm     Systemic VTI:  0.30 m MV Peak grad:  5.0 mmHg     Systemic Diam: 2.70 cm MV Mean grad:  2.0 mmHg MV Vmax:       1.12 m/s MV Vmean:      71.5 cm/s MV Decel Time: 340 msec MV E velocity: 99.80 cm/s MV A velocity: 123.00 cm/s MV E/A ratio:  0.81 Mihai Croitoru MD Electronically signed by Thurmon Fair MD Signature Date/Time: 11/04/2021/3:18:01 PM    Final    CT HEAD CODE STROKE WO CONTRAST  Result Date: 11/03/2021 CLINICAL DATA:  Code stroke. Neuro deficit, acute, stroke suspected. EXAM: CT HEAD WITHOUT CONTRAST TECHNIQUE: Contiguous axial images were obtained from the base of the skull through the vertex without intravenous contrast. RADIATION DOSE REDUCTION: This exam was performed according to the departmental dose-optimization program which includes automated exposure control, adjustment of the mA and/or kV according to patient size and/or use of iterative reconstruction technique. COMPARISON:  No pertinent prior exams available for comparison. FINDINGS: Brain: Mild generalized cerebral atrophy. Mild patchy and ill-defined hypoattenuation within the cerebral white matter, nonspecific but compatible chronic small vessel ischemic disease. Retrocerebellar CSF density prominence at midline, which may reflect a mega cisterna magna or posterior fossa arachnoid cyst. There is no acute  intracranial hemorrhage. No demarcated cortical infarct. No extra-axial fluid collection. No evidence of an intracranial mass. No midline shift. Vascular: No hyperdense vessel.  Atherosclerotic calcifications. Skull: Normal. Negative for fracture or focal lesion. Sinuses/Orbits: Visualized orbits show no acute finding. Mild mucosal thickening within the left frontal and right ethmoid sinuses. ASPECTS ([place] Stroke Program Early CT Score) - Ganglionic level infarction (caudate, lentiform nuclei, internal capsule, insula, M1-M3 cortex): 7 - Supraganglionic infarction (M4-M6 cortex): 3 Total score (0-10 with 10 being normal): 10 These results were communicated to Dr. Derry Lory At 10:28 amon 3/4/2023by text page via the [facility] messaging system. IMPRESSION: No evidence of acute intracranial abnormality. Mild chronic small vessel ischemic changes within the cerebral white matter. Mild generalized cerebral atrophy. Electronically Signed   By: Jackey Loge D.O.   On: 11/03/2021 10:28   CT ANGIO HEAD NECK W WO CM (CODE STROKE)  Result Date: 11/03/2021 CLINICAL DATA:  Provided history: Neuro deficit, acute, stroke suspected. EXAM: CT ANGIOGRAPHY HEAD AND NECK TECHNIQUE: Multidetector CT imaging of the head and neck was performed using the standard protocol during bolus administration of intravenous contrast. Multiplanar CT image reconstructions and MIPs were obtained to evaluate the vascular anatomy. Carotid stenosis measurements (when applicable) are obtained utilizing NASCET criteria, using the distal internal carotid diameter as the denominator. RADIATION DOSE REDUCTION: This exam was performed according to the departmental dose-optimization program which includes automated exposure control, adjustment of the mA and/or kV according to patient size and/or use of iterative reconstruction  technique. CONTRAST:  Administered contrast not known at this time. COMPARISON:  Noncontrast head CT performed earlier the same day.  FINDINGS: CTA NECK FINDINGS Aortic arch: Measured aortic branching. Atherosclerotic plaque within the visualized aortic arch and proximal major branch vessels of the neck. No hemodynamically significant innominate or proximal subclavian artery stenosis. Right carotid system: CCA and ICA patent within the neck without significant stenosis (50% or greater). Mild atherosclerotic plaque about the carotid bifurcation and within the proximal ICA. Tortuosity of the cervical ICA. Left carotid system: CCA and ICA patent within the neck without significant stenosis (50% or greater). Mild atherosclerotic plaque about the carotid bifurcation and within the proximal ICA. Vertebral arteries: Vertebral arteries codominant and patent within the neck without significant stenosis. Minimal nonstenotic calcified plaque within the proximal left vertebral artery. Skeleton: No acute bony abnormality or aggressive osseous lesion. Cervical spondylosis. Cervical levocurvature with partially imaged thoracic dextrocurvature. Other neck: No neck mass or cervical lymphadenopathy. Upper chest: Prior median sternotomy. No consolidation within the imaged lung apices. Review of the MIP images confirms the above findings CTA HEAD FINDINGS Anterior circulation: The intracranial internal carotid arteries are patent. Calcified plaque within both vessels. No more than mild stenosis within the intracranial right ICA. Up to moderate stenosis within the cavernous left ICA. The M1 middle cerebral arteries are patent. No M2 proximal branch occlusion is identified. Atherosclerotic irregularity of the M2 and more distal MCA vessels, bilaterally. Most notably, there is a site of severe stenosis within a mid M2 right MCA vessel (series 12, image 8) and a site of apparent severe stenosis within a superior division proximal M2 left MCA vessel (series 12, image 23). The anterior cerebral arteries are patent. No intracranial aneurysm is identified. Posterior  circulation: The intracranial vertebral arteries are patent. Calcified plaque within the intracranial vertebral arteries, bilaterally with no more than mild stenosis. The basilar artery is patent. The posterior cerebral arteries are patent. A small right posterior communicating artery is present. The left posterior communicating artery is diminutive or absent. Venous sinuses: Within the limitations of contrast timing, no convincing thrombus. Anatomic variants: As described. Review of the MIP images confirms the above findings No emergent large vessel occlusion identified. These results were communicated to Dr. Larinda Buttery 3/4/2023by text page via the [facility] messaging system. IMPRESSION: CTA neck: 1. The common carotid, internal carotid and vertebral arteries are patent within the neck without hemodynamically significant stenosis. Mild atherosclerotic plaque about the carotid bifurcations and within the proximal ICAs, and also within the proximal left vertebral artery. 2.  Aortic Atherosclerosis (ICD10-I70.0). CTA head: 1. No intracranial large vessel occlusion is identified. 2. Intracranial atherosclerotic disease with multifocal stenoses, most notably as follows. 3. Up to moderate stenosis within the cavernous left ICA. 4. Site of severe stenosis within a mid M2 right MCA vessel. 5. Site of severe stenosis within a proximal M2 left MCA vessel. Electronically Signed   By: Jackey Loge D.O.   On: 11/03/2021 10:47     PHYSICAL EXAM  Temp:  [96.8 F (36 C)-99.6 F (37.6 C)] 96.8 F (36 C) (03/06 1144) Pulse Rate:  [73-104] 73 (03/06 1400) Resp:  [10-25] 15 (03/06 1400) BP: (124-207)/(64-128) 148/69 (03/06 1400) SpO2:  [87 %-98 %] 94 % (03/06 1400)  General - Well nourished, well developed, in no apparent distress.  Ophthalmologic - fundi not visualized due to noncooperation.  Cardiovascular - Regular rhythm and rate.  Mental Status -  Level of arousal and orientation to time, place, and person were  intact. Language including expression, naming, repetition, comprehension was assessed and found intact. Fund of Knowledge was assessed and was intact.  Cranial Nerves II - XII - II - Visual field intact OU. III, IV, VI - Extraocular movements intact. V - Facial sensation intact bilaterally. VII - Facial movement intact bilaterally. VIII - Hearing & vestibular intact bilaterally. X - Palate elevates symmetrically. XI - Chin turning & shoulder shrug intact bilaterally. XII - Tongue protrusion intact.  Motor Strength - The patients strength was normal in all extremities and pronator drift was absent.  Bulk was normal and fasciculations were absent.   Motor Tone - Muscle tone was assessed at the neck and appendages and was normal.  Reflexes - The patients reflexes were symmetrical in all extremities and he had no pathological reflexes.  Sensory - Light touch, temperature/pinprick were assessed and were symmetrical.    Coordination - The patient had normal movements in the hands and feet with no ataxia or dysmetria.  Tremor was absent.  Gait and Station - deferred.   ASSESSMENT/PLAN [name] is a 66 y.o. male with history of hypertension, hyperlipidemia, diabetes, CAD status post CABG in 2016 admitted for right hand numbness, clumsiness, right facial droop and aphasia.  Initially symptoms largely resolved in ER, TNK was on hold.  However later patient aphasia getting worse, status post TNK    Suspected stroke status post TNK  TIA due to intracranial stenosis vs. Hypertensive encephalopathy CT no acute abnormality x 2 CTA head and neck intracranial stenosis with bilateral M2 severe stenosis MRI no acute infarct MRA head intracranial atherosclerosis with no hemodynamically significant stenosis on intracranial MRA 2D Echo EF 70-75% LDL 182 HgbA1c 6.4 SCDs for VTE prophylaxis aspirin 81 mg daily prior to admission, now on aspirin 325 mg daily and clopidogrel 75 mg daily for  3 months and then Plavix alone given questionable bilateral M2 stenosis. Patient counseled to be compliant with his antithrombotic medications Ongoing aggressive stroke risk factor management Therapy recommendations: Pending Disposition: Pending  Diabetes HgbA1c 6.4 goal < 7.0 Controlled CBG monitoring SSI DM education and close PCP follow up  Hypertensive urgency Hypertensive encephalopathy High on presentation, still fluctuate Episodes of HA, confusion, speech changes associated with high BP Resume home meds with norvasc 10, avapro 300 and Toprol 100 daily Long term BP goal normotensive  Orthostatic hypotension 3/5 Per OT, BP taken after activity: sitting 108/54; standing 87/68 (pt unable to remain standing for pressure), after 1 min in sitting 125/65.  Recurrent aphasia when transfer from chair back to bed, resolved after lying down.  Put on TED hose Close BP monitoring Slow positioning changes, gradually increase activity PT/OT following  Hyperlipidemia Home meds: Lipitor 20 LDL 182, goal < 70 Now on Lipitor 80 Continue statin at discharge  Other Stroke Risk Factors Advanced age Obesity, Body mass index is 37.01 kg/m.  Coronary artery disease status post CABG in 2016  Other Active Problems Episodes of agitation, combative, delirium - concerning related to Benadryl, close monitoring  Hospital day # 2  This patient is critically ill due to stroke symptoms status post TNK, hypertensive emergency, orthostatic hypotension, and at significant risk of neurological worsening, death form recurrent stroke, hemorrhagic transformation, bleeding from TIA, hypertensive encephalopathy. This patient's care requires constant monitoring of vital signs, hemodynamics, respiratory and cardiac monitoring, review of multiple databases, neurological assessment, discussion with family, other specialists and medical decision making of high complexity. I spent 35 minutes of neurocritical care  time in the care  of this patient. I had long discussion with wife and the patient.  At bedside, updated pt current condition, treatment plan and potential prognosis, and answered all the questions.  They expressed understanding and appreciation.    Marvel Plan, MD PhD Stroke Neurology 11/05/2021 2:35 PM    To contact Stroke Continuity provider, please refer to [email or web address]. After hours, contact General Neurology

## 2021-11-06 LAB — BASIC METABOLIC PANEL
Anion gap: 9 (ref 5–15)
BUN: 11 mg/dL (ref 8–23)
CO2: 25 mmol/L (ref 22–32)
Calcium: 8.4 mg/dL — ABNORMAL LOW (ref 8.9–10.3)
Chloride: 95 mmol/L — ABNORMAL LOW (ref 98–111)
Creatinine, Ser: 0.86 mg/dL (ref 0.61–1.24)
GFR, Estimated: >60 mL/min (ref >60)
Glucose, Bld: 163 mg/dL — ABNORMAL HIGH (ref 70–99)
Potassium: 4.1 mmol/L (ref 3.5–5.1)
Sodium: 129 mmol/L — ABNORMAL LOW (ref 135–145)

## 2021-11-06 LAB — CBC
HCT: 42.5 % (ref 39.0–52.0)
Hemoglobin: 14.3 g/dL (ref 13.0–17.0)
MCH: 31 pg (ref 26.0–34.0)
MCHC: 33.6 g/dL (ref 30.0–36.0)
MCV: 92.2 fL (ref 80.0–100.0)
Platelets: 218 10*3/uL (ref 150–400)
RBC: 4.61 MIL/uL (ref 4.22–5.81)
RDW: 13.2 % (ref 11.5–15.5)
WBC: 13.1 10*3/uL — ABNORMAL HIGH (ref 4.0–10.5)
nRBC: 0 % (ref 0.0–0.2)

## 2021-11-06 LAB — GLUCOSE, CAPILLARY
Glucose-Capillary: 165 mg/dL — ABNORMAL HIGH (ref 70–99)
Glucose-Capillary: 227 mg/dL — ABNORMAL HIGH (ref 70–99)

## 2021-11-06 MED ORDER — CLOPIDOGREL BISULFATE 75 MG PO TABS: 75.0000 mg | ORAL_TABLET | Freq: Every day | ORAL | 1 refills | Status: DC

## 2021-11-06 MED ORDER — ATORVASTATIN CALCIUM 80 MG PO TABS: 80.0000 mg | ORAL_TABLET | Freq: Every day | ORAL | 1 refills | Status: AC

## 2021-11-06 MED ORDER — ASPIRIN 325 MG PO TBEC: 325.0000 mg | DELAYED_RELEASE_TABLET | Freq: Every day | ORAL | 0 refills | Status: AC

## 2021-11-06 MED ORDER — ASPIRIN EC 325 MG PO TBEC
325.0000 mg | DELAYED_RELEASE_TABLET | Freq: Every day | ORAL | Status: DC
Start: 2021-11-06 — End: 2021-11-06

## 2021-11-06 NOTE — Progress Notes (Signed)
Physical Therapy Treatment and Discharge ?Patient Details ?Name: Franklin Calderon ?MRN: [id number] ?DOB: 1956-08-20 ?Today's Date: 11/06/2021 ? ? ?History of Present Illness 66 yo admitted as code stroke due to aphasia, R facial droop and  R hand numbness. Symptoms worsened in ED and pt given tNK. 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 ? ?  ?PT Comments  ? ? Pt has continued to improve since yesterday's visit. Had already been up with OT and was much more steady with PT. Ambulated normal pace in hallway and able to change pace and direction as well as make quick starts and stops without LOB. Educated on taking caution when first up in the morning as this seems to be when balance is more impaired than his baseline. Worked on tandem stance and SL stance after ambulation and encouraged him to continue working on this at home as well as join a community based program. Wife present for education. No further PT needs at this time.  ?  ?Recommendations for follow up therapy are one component of a multi-disciplinary discharge planning process, led by the attending physician.  Recommendations may be updated based on patient status, additional functional criteria and insurance authorization. ? ?Follow Up Recommendations ? No PT follow up ?  ?  ?Assistance Recommended at Discharge Intermittent Supervision/Assistance  ?Patient can return home with the following Assistance with cooking/housework ?  ?Equipment Recommendations ? None recommended by PT  ?  ?Recommendations for Other Services   ? ? ?  ?Precautions / Restrictions Precautions ?Precautions: Fall ?Restrictions ?Weight Bearing Restrictions: No  ?  ? ?Mobility ? Bed Mobility ?Overal bed mobility: Modified Independent ?  ?  ?  ?  ?  ?  ?General bed mobility  comments: OOB in chair ?  ? ?Transfers ?Overall transfer level: Modified independent ?Equipment used: None ?Transfers: Sit to/from Stand ?Sit to Stand: Modified independent (Device/Increase time) ?  ?  ?  ?  ?  ?General transfer comment: pt transferred today without LOB or instability. Had been up several times already today which seemed to help. Educated on taking caution in the mornings when he first gets up. Wife will be with him at these times ?  ? ?Ambulation/Gait ?Ambulation/Gait assistance: Modified independent (Device/Increase time) ?Gait Distance (Feet): 500 Feet ?Assistive device: None ?Gait Pattern/deviations: Drifts right/left, WFL(Within Functional Limits) ?Gait velocity: WFL ?Gait velocity interpretation: >4.37 ft/sec, indicative of normal walking speed ?  ?General Gait Details: pt able to maintain normal pace, increase pace and slow down, change directions and stop quickly, and walk bkwds, safely with no LOB ? ? ?Stairs ?  ?  ?  ?  ?  ? ? ?Wheelchair Mobility ?  ? ?Modified Rankin (Stroke Patients Only) ?Modified Rankin (Stroke Patients Only) ?Pre-Morbid Rankin Score: No symptoms ?Modified Rankin: No significant disability ? ? ?  ?Balance Overall balance assessment: Needs assistance ?Sitting-balance support: No upper extremity supported, Feet supported ?Sitting balance-Leahy Scale: Normal ?  ?  ?Standing balance support: No upper extremity supported ?Standing balance-Leahy Scale: Fair ?Standing balance comment: pt able to maintain double limb stance with dynamic activities without LOB. Worked on SL stance and tandem as pt Struggles with this. Encouraged to keep working on it at home ?Single Leg Stance - Right Leg: 0 ?Single Leg Stance - Left Leg: 0 ?  ?  ?  ?  [place] - Eyes Closed: 1 ?  ?High Level Balance Comments: needs min guard for tandem stance, min A for SL stance ?  ?  ?  ?  ? ?  ?Cognition Arousal/Alertness: Awake/alert ?Behavior During Therapy: [facility] for tasks assessed/performed ?Overall  Cognitive Status: Within Functional Limits for tasks assessed ?  ?  ?  ?  ?  ?  ?  ?  ?  ?  ?  ?  ?  ?  ?  ?  ?General Comments: pt more attentive to mobility today. Seems close to baseline ?  ?  ? ?  ?Exercises   ? ?  ?General Comments General comments (skin integrity, edema, etc.): VSS ?  ?  ? ?Pertinent Vitals/Pain Pain Assessment ?Pain Assessment: No/denies pain ?Faces Pain Scale: Hurts a little bit  ? ? ?Home Living   ?  ?  ?  ?  ?  ?  ?  ?  ?  ?   ?  ?Prior Function    ?  ?  ?   ? ?PT Goals (current goals can now be found in the care plan section) Acute Rehab PT Goals ?Patient Stated Goal: home ?PT Goal Formulation: With patient/family ?Time For Goal Achievement: 11/18/21 ?Potential to Achieve Goals: Good ?Progress towards PT goals: Goals met/education completed, patient discharged from PT ? ?  ?Frequency ? ? ? Min 4X/week ? ? ? ?  ?PT Plan Current plan remains appropriate  ? ? ?Co-evaluation   ?  ?  ?  ?  ? ?  ?AM-PAC PT "6 Clicks" Mobility   ?Outcome Measure ? Help needed turning from your back to your side while in a flat bed without using bedrails?: None ?Help needed moving from lying on your back to sitting on the side of a flat bed without using bedrails?: None ?Help needed moving to and from a bed to a chair (including a wheelchair)?: None ?Help needed standing up from a chair using your arms (e.g., wheelchair or bedside chair)?: A Little ?Help needed to walk in hospital room?: A Little ?Help needed climbing 3-5 steps with a railing? : None ?6 Click Score: 22 ? ?  ?End of Session   ?Activity Tolerance: Patient tolerated treatment well ?Patient left: in chair;with call bell/phone within reach;with family/visitor present ?Nurse Communication: Mobility status ?PT Visit Diagnosis: Unsteadiness on feet (R26.81);Difficulty in walking, not elsewhere classified (R26.2) ?  ? ? ?Time: 5670-1410 ?PT Time Calculation (min) (ACUTE ONLY): 17 min ? ?Charges:  $Gait Training: 8-22 mins          ?          ? ?Leighton Roach, PT  ?Acute Rehab Services ? Pager [phone] ?Office [phone] ? ? ? ?[place] ?11/06/2021, 1:54 PM ? ?

## 2021-11-06 NOTE — Progress Notes (Signed)
Occupational Therapy Treatment ?Patient Details ?Name: Franklin Calderon ?MRN: [id number] ?DOB: 1955-10-13 ?Today's Date: 11/06/2021 ? ? ?History of present illness 66 yo admitted as code stroke due to aphasia, R facial droop and  R hand numbness. Symptoms worsened in ED and pt given tNK. 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 ?  ?OT comments ? Pt has made significant progress, however demonstrates balance deficits, which appear worse than baseline. Educated on strategies to reduce risk of falls. Wife will be able to assist as needed. No further OT needed.   ? ?Recommendations for follow up therapy are one component of a multi-disciplinary discharge planning process, led by the attending physician.  Recommendations may be updated based on patient status, additional functional criteria and insurance authorization. ?   ?Follow Up Recommendations ? No OT follow up  ?  ?Assistance Recommended at Discharge Intermittent Supervision/Assistance  ?Patient can return home with the following ? A little help with bathing/dressing/bathroom;Direct supervision/assist for medications management;Direct supervision/assist for financial management;Assist for transportation ?  ?Equipment Recommendations ? None recommended by OT;Tub/shower seat (wife to purchase)  ?  ?Recommendations for Other Services   ? ?  ?Precautions / Restrictions Precautions ?Precautions: Fall  ? ? ?  ? ?Mobility Bed Mobility ?  ?  ?  ?  ?  ?  ?  ?General bed mobility comments: OOB in chair ?  ? ?Transfers ?  ?  ?  ?  ?  ?  ?  ?  ?  ?General transfer comment: S; initial sway, loss of balance. Pt staes he does that "sometimes". appears worse than baseline. discussed sfaety precautions adn strategies to reduce risk of falls ?  ?  ?Balance   ?  ?  ?  ?  ?   ?Standing balance-Leahy Scale: Fair ?  ?  ?  ?  ?  ?  ?  ?  ?  ?  ?  ?  ?   ? ?ADL either performed or assessed with clinical judgement  ? ?ADL Overall ADL's : Needs assistance/impaired ?  ?  ?  ?  ?  ?  ?  ?  ?  ?  ?  ?  ?  ?  ?  ?  ?  ?  ?  ?General ADL Comments: recommend direct S initially due to occasional unsteadiness ?  ? ?Extremity/Trunk Assessment Upper Extremity Assessment ?Upper Extremity Assessment: Overall WFL for tasks assessed ?  ?Lower Extremity Assessment ?Lower Extremity Assessment: Defer to PT evaluation ?  ?  ?  ? ?Vision   ?Vision Assessment?: No apparent visual deficits ?  ?Perception   ?  ?Praxis   ?  ? ?Cognition Arousal/Alertness: Awake/alert ?Behavior During Therapy: [facility] for tasks assessed/performed ?Overall Cognitive Status: Within Functional Limits for tasks assessed ?  ?  ?  ?  ?  ?  ?  ?  ?  ?  ?  ?  ?  ?  ?  ?  ?General Comments: wife reports cognition appears WFL; Educated iwfe/pt aobut need to supervie him in IADL tasks and notify MD if they have any concerns regarding cognitive changes ?  ?  ?   ?Exercises   ? ?  ?Shoulder Instructions   ? ? ?  ?General Comments    ? ? ?Pertinent Vitals/  Pain       Pain Assessment ?Pain Assessment: No/denies pain ? ?Home Living   ?  ?  ?  ?  ?  ?  ?  ?  ?  ?  ?  ?  ?  ?  ?  ?  ?  ?  ? ?  ?Prior Functioning/Environment    ?  ?  ?  ?   ? ?Frequency ? Min 2X/week  ? ? ? ? ?  ?Progress Toward Goals ? ?OT Goals(current goals can now be found in the care plan section) ? Progress towards OT goals: Goals met/education completed, patient discharged from OT ? ?Acute Rehab OT Goals ?Patient Stated Goal: to go home ?OT Goal Formulation: With patient/family  ?Plan Discharge plan remains appropriate   ? ?Co-evaluation ? ? ?   ?  ?  ?  ?  ? ?  ?AM-PAC OT "6 Clicks" Daily Activity     ?Outcome Measure ? ? Help from another person eating meals?: None ?Help from another person taking care of personal grooming?: A Little ?Help from another person toileting, which  includes using toliet, bedpan, or urinal?: A Little ?Help from another person bathing (including washing, rinsing, drying)?: A Little ?Help from another person to put on and taking off regular upper body clothing?: A Little ?Help from another person to put on and taking off regular lower body clothing?: A Little ?6 Click Score: 19 ? ?  ?End of Session   ? ?OT Visit Diagnosis: Unsteadiness on feet (R26.81);Other symptoms and signs involving the nervous system (R29.898);Other symptoms and signs involving cognitive function;Dizziness and giddiness (R42) ?  ?Activity Tolerance Patient tolerated treatment well ?  ?Patient Left in chair;with call bell/phone within reach;with chair alarm set;with family/visitor present ?  ?Nurse Communication Mobility status ?  ? ?   ? ?Time: 1210-1225 ?OT Time Calculation (min): 15 min ? ?Charges: OT General Charges ?$OT Visit: 1 Visit ?OT Treatments ?$Self Care/Home Management : 8-22 mins ? ?[facility], OT/L  ? ?Acute OT Clinical Specialist ?Acute Rehabilitation Services ?Pager [phone] ?Office [phone]  ? ?Ryiah Bellissimo,[place] ?11/06/2021, 12:58 PM ?

## 2021-11-06 NOTE — Progress Notes (Signed)
PT/OT worked with patient. Patient given d/c instructions. Spouse at bedside. No complaints at the current time. Taken to car via [place]. ?

## 2021-11-06 NOTE — Discharge Summary (Addendum)
Stroke Discharge Summary  Patient ID: Franklin Calderon   MRN: [id number]      DOB: 12/03/1955  Date of Admission: 11/03/2021 Date of Discharge: 11/06/2021  Attending Physician:  Stroke, Md, MD, Stroke MD Patient's PCP:  Pcp, No  DISCHARGE DIAGNOSIS:  Principal Problem:   TIA vs. Hypertensive encephalopathy   Orthostatic hypotension   HLD   DM   obesity   Allergies as of 11/06/2021       Reactions   Benadryl [diphenhydramine] Other (See Comments)   Paradoxical agitation, combative, delirium   Codeine Nausea And Vomiting        Medication List     TAKE these medications    amLODipine 10 MG tablet Commonly known as: NORVASC Take 10 mg by mouth daily.   aspirin 325 MG EC tablet Take 1 tablet (325 mg total) by mouth daily. What changed:  medication strength how much to take additional instructions   atorvastatin 80 MG tablet Commonly known as: LIPITOR Take 1 tablet (80 mg total) by mouth daily. Start taking on: November 07, 2021 What changed:  medication strength how much to take   Breo Ellipta 100-25 MCG/ACT Aepb Generic drug: fluticasone furoate-vilanterol Inhale 1 puff into the lungs daily as needed (sob).   cholecalciferol 25 MCG (1000 UNIT) tablet Commonly known as: VITAMIN D3 Take 1,000 Units by mouth every evening.   clopidogrel 75 MG tablet Commonly known as: PLAVIX Take 1 tablet (75 mg total) by mouth daily. Start taking on: November 07, 2021   Co-Enzyme Q-10 30 MG Caps Take 30 mg by mouth every evening.   doxazosin 1 MG tablet Commonly known as: CARDURA Take 1 mg by mouth daily.   metFORMIN 1000 MG tablet Commonly known as: GLUCOPHAGE Take 1,000 mg by mouth 2 (two) times daily with a meal.   metoprolol succinate 100 MG 24 hr tablet Commonly known as: TOPROL-XL Take 100 mg by mouth daily.   Multi-Vitamins Tabs Take 1 tablet by mouth every evening.   olmesartan 40 MG tablet Commonly known as: BENICAR Take 40 mg by mouth daily.   Ozempic  (0.25 or 0.5 MG/DOSE) 2 MG/1.5ML Sopn Generic drug: Semaglutide(0.25 or 0.5MG /DOS) Inject 0.25 mg into the skin once a week.   pioglitazone 15 MG tablet Commonly known as: ACTOS Take 15 mg by mouth daily.   Evaristo Bury FlexTouch 200 UNIT/ML FlexTouch Pen Generic drug: insulin degludec Inject 70 Units into the skin every evening.        LABORATORY STUDIES CBC    Component Value Date/Time   WBC 13.1 (H) 11/06/2021 0324   RBC 4.61 11/06/2021 0324   HGB 14.3 11/06/2021 0324   HCT 42.5 11/06/2021 0324   PLT 218 11/06/2021 0324   MCV 92.2 11/06/2021 0324   MCH 31.0 11/06/2021 0324   MCHC 33.6 11/06/2021 0324   RDW 13.2 11/06/2021 0324   LYMPHSABS 3.6 11/03/2021 1016   MONOABS 0.5 11/03/2021 1016   EOSABS 0.1 11/03/2021 1016   BASOSABS 0.0 11/03/2021 1016   CMP    Component Value Date/Time   NA 129 (L) 11/06/2021 0324   K 4.1 11/06/2021 0324   CL 95 (L) 11/06/2021 0324   CO2 25 11/06/2021 0324   GLUCOSE 163 (H) 11/06/2021 0324   BUN 11 11/06/2021 0324   CREATININE 0.86 11/06/2021 0324   CALCIUM 8.4 (L) 11/06/2021 0324   PROT 6.7 11/03/2021 1016   ALBUMIN 4.0 11/03/2021 1016   AST 19 11/03/2021 1016   ALT 18  11/03/2021 1016   ALKPHOS 59 11/03/2021 1016   BILITOT 0.6 11/03/2021 1016   GFRNONAA >60 11/06/2021 0324   COAGS Lab Results  Component Value Date   INR 1.1 11/03/2021   Lipid Panel    Component Value Date/Time   CHOL 241 (H) 11/04/2021 0326   TRIG 46 11/04/2021 0326   HDL 50 11/04/2021 0326   CHOLHDL 4.8 11/04/2021 0326   VLDL 9 11/04/2021 0326   LDLCALC 182 (H) 11/04/2021 0326   HgbA1C  Lab Results  Component Value Date   HGBA1C 6.4 (H) 11/04/2021   Urinalysis No results found for: COLORURINE, APPEARANCEUR, LABSPEC, PHURINE, GLUCOSEU, HGBUR, BILIRUBINUR, KETONESUR, PROTEINUR, UROBILINOGEN, NITRITE, LEUKOCYTESUR Urine Drug Screen No results found for: LABOPIA, COCAINSCRNUR, LABBENZ, AMPHETMU, THCU, LABBARB  Alcohol Level No results found for:  ETH   SIGNIFICANT DIAGNOSTIC STUDIES CT HEAD WO CONTRAST ( )  Result Date: 11/04/2021 CLINICAL DATA:  Stroke, follow-up; vomiting, headache, speech changes. EXAM: CT HEAD WITHOUT CONTRAST TECHNIQUE: Contiguous axial images were obtained from the base of the skull through the vertex without intravenous contrast. RADIATION DOSE REDUCTION: This exam was performed according to the departmental dose-optimization program which includes automated exposure control, adjustment of the mA and/or kV according to patient size and/or use of iterative reconstruction technique. COMPARISON:  MRI brain and MRA head 11/04/2021. FINDINGS: Brain: Streak and beam hardening artifact arising from dental restoration partially obscures the posterior fossa contents. No age advanced or lobar predominant cerebral atrophy. Mild-to-moderate patchy and ill-defined hypoattenuation within the cerebral white matter, compatible with nonspecific chronic cerebral white matter disease. A known small chronic infarct within the left cerebellar hemisphere was better appreciated on the brain MRI performed earlier today. There is no acute intracranial hemorrhage. No demarcated cortical infarct. No extra-axial fluid collection. No evidence of an intracranial mass. No midline shift. Vascular: No hyperdense vessel.  Atherosclerotic calcifications. Skull: Normal. Negative for fracture or focal lesion. Sinuses/Orbits: Visualized orbits show no acute finding. No significant paranasal sinus disease. IMPRESSION: Streak and beam hardening artifact arising from dental restoration partially obscures the posterior fossa contents. No evidence of acute intracranial abnormality or appreciable change from the MRI brain performed earlier today. Electronically Signed   By: Jackey Loge D.O.   On: 11/04/2021 16:08   CT HEAD WO CONTRAST ( )  Result Date: 11/03/2021 CLINICAL DATA:  CVA EXAM: CT HEAD WITHOUT CONTRAST TECHNIQUE: Contiguous axial images were obtained  from the base of the skull through the vertex without intravenous contrast. RADIATION DOSE REDUCTION: This exam was performed according to the departmental dose-optimization program which includes automated exposure control, adjustment of the mA and/or kV according to patient size and/or use of iterative reconstruction technique. COMPARISON:  Previous study done earlier today FINDINGS: Brain: No acute intracranial findings are seen. Ventricles are not dilated. There is no shift of midline structures. Cortical sulci are prominent. Cisterna magna is prominent. Vascular: Unremarkable. Skull: Unremarkable. Sinuses/Orbits: There is mild mucosal thickening in the ethmoid sinus. Other: None IMPRESSION: No acute intracranial findings are seen in noncontrast CT brain. Atrophy. Electronically Signed   By: Ernie Avena M.D.   On: 11/03/2021 14:47   MR ANGIO HEAD WO CONTRAST  Result Date: 11/04/2021 CLINICAL DATA:  66 year old male code stroke presentation yesterday. EXAM: MRI HEAD WITHOUT CONTRAST MRA HEAD WITHOUT CONTRAST TECHNIQUE: Multiplanar, multi-echo pulse sequences of the brain and surrounding structures were acquired without intravenous contrast. Angiographic images of the Circle of Willis were acquired using MRA technique without intravenous contrast. COMPARISON:  CT head, CTA  head and neck yesterday. FINDINGS: MRI HEAD FINDINGS Brain: No restricted diffusion to suggest acute infarction. No midline shift, mass effect, evidence of mass lesion, ventriculomegaly, extra-axial collection or acute intracranial hemorrhage. Cervicomedullary junction and pituitary are within normal limits. Mega cisterna magna, normal variant. Scattered mild to moderate for age, occasionally patchy bilateral cerebral white matter T2 and FLAIR hyperintensity. And on T2* imaging numerous chronic microhemorrhages scattered throughout the brain, including some suspected within the brainstem. Cerebellum might be spared. No cortical  encephalomalacia identified. No blood or debris within the ventricular system. No T1 or T2 signal changes within the deep gray matter nuclei. But there is a small chronic left cerebellar linear infarct on series 9, image 9. Vascular: Major intracranial vascular flow voids are preserved. Skull and upper cervical spine: Negative. Visualized bone marrow signal is within normal limits. Sinuses/Orbits: Postoperative changes to both globes. Paranasal Visualized paranasal sinuses and mastoids are stable and well aerated. Other: Visible internal auditory structures appear normal. Negative visible scalp and face. MRA HEAD FINDINGS Anterior circulation: Antegrade flow in both ICA siphons. Bilateral siphon irregularity in keeping with atherosclerosis, maximal in the left supraclinoid segment although only mild stenosis results. Patent carotid termini. Normal ophthalmic artery origins. Normal MCA and ACA origins. Mildly dominant left A1. Normal anterior communicating artery. Patent MCA M1 segments and bifurcations without stenosis. Visible bilateral MCA and ACA branches are within normal limits. Posterior circulation: Antegrade flow in the posterior circulation with codominant distal vertebral arteries. Normal right PICA origin. No distal vertebral or vertebrobasilar junction stenosis. Patent basilar artery without stenosis. Normal SCA and PCA origins. Posterior communicating arteries are diminutive or absent. Bilateral PCA branches are within normal limits. Other findings: Occasional chronic microhemorrhage related susceptibility is visible. Anatomic variants: Mildly dominant left ACA A1. IMPRESSION: 1. No acute intracranial abnormality. 2. 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 Multiple chronic micro-hemorrhages scattered in the brain. Given their extent on T2* imaging Amyloid Angiopathy is difficult to exclude. Follow-up SWI imaging may  be valuable. 3. Intracranial atherosclerosis with no hemodynamically significant stenosis on intracranial MRA. Electronically Signed   By: Odessa FlemingH  Hall M.D.   On: 11/04/2021 12:18   MR BRAIN WO CONTRAST  Result Date: 11/04/2021 CLINICAL DATA:  66 year old male code stroke presentation yesterday. EXAM: MRI HEAD WITHOUT CONTRAST MRA HEAD WITHOUT CONTRAST TECHNIQUE: Multiplanar, multi-echo pulse sequences of the brain and surrounding structures were acquired without intravenous contrast. Angiographic images of the Circle of Willis were acquired using MRA technique without intravenous contrast. COMPARISON:  CT head, CTA head and neck yesterday. FINDINGS: MRI HEAD FINDINGS Brain: No restricted diffusion to suggest acute infarction. No midline shift, mass effect, evidence of mass lesion, ventriculomegaly, extra-axial collection or acute intracranial hemorrhage. Cervicomedullary junction and pituitary are within normal limits. Mega cisterna magna, normal variant. Scattered mild to moderate for age, occasionally patchy bilateral cerebral white matter T2 and FLAIR hyperintensity. And on T2* imaging numerous chronic microhemorrhages scattered throughout the brain, including some suspected within the brainstem. Cerebellum might be spared. No cortical encephalomalacia identified. No blood or debris within the ventricular system. No T1 or T2 signal changes within the deep gray matter nuclei. But there is a small chronic left cerebellar linear infarct on series 9, image 9. Vascular: Major intracranial vascular flow voids are preserved. Skull and upper cervical spine: Negative. Visualized bone marrow signal is within normal limits. Sinuses/Orbits: Postoperative changes to both globes. Paranasal Visualized paranasal sinuses and  mastoids are stable and well aerated. Other: Visible internal auditory structures appear normal. Negative visible scalp and face. MRA HEAD FINDINGS Anterior circulation: Antegrade flow in both ICA siphons.  Bilateral siphon irregularity in keeping with atherosclerosis, maximal in the left supraclinoid segment although only mild stenosis results. Patent carotid termini. Normal ophthalmic artery origins. Normal MCA and ACA origins. Mildly dominant left A1. Normal anterior communicating artery. Patent MCA M1 segments and bifurcations without stenosis. Visible bilateral MCA and ACA branches are within normal limits. Posterior circulation: Antegrade flow in the posterior circulation with codominant distal vertebral arteries. Normal right PICA origin. No distal vertebral or vertebrobasilar junction stenosis. Patent basilar artery without stenosis. Normal SCA and PCA origins. Posterior communicating arteries are diminutive or absent. Bilateral PCA branches are within normal limits. Other findings: Occasional chronic microhemorrhage related susceptibility is visible. Anatomic variants: Mildly dominant left ACA A1. IMPRESSION: 1. No acute intracranial abnormality. 2. 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 Multiple chronic micro-hemorrhages scattered in the brain. Given their extent on T2* imaging Amyloid Angiopathy is difficult to exclude. Follow-up SWI imaging may be valuable. 3. Intracranial atherosclerosis with no hemodynamically significant stenosis on intracranial MRA. Electronically Signed   By: Odessa FlemingH  Hall M.D.   On: 11/04/2021 12:18   EEG adult  Result Date: 11/03/2021 Jefferson FuelStack, Colleen M, MD     11/03/2021  5:30 PM Routine EEG Report Tillman SersRobert Frick is a 66 y.o. male with a history of stroke who is undergoing an EEG to evaluate for seizures. Report: This EEG was acquired with electrodes placed according to the International 10-20 electrode system (including Fp1, Fp2, F3, F4, C3, C4, P3, P4, O1, O2, T3, T4, T5, T6, A1, A2, Fz, Cz, Pz). The following electrodes were missing or displaced: none. There was no clear waking rhythm. The majority  of the initial recording exhibited a background in the delta range approximately 3 Hz with brief interludes of alpha range activity. This activity was reactive to stimulation. Towards the end of the recording more of the background activity was in the alpha range again approximately at 8 Hz. There were occasional sleep spindles noted. There was no focal slowing. There were no interictal epileptiform discharges. There were no electrographic seizures identified. Photic stimulation and hyperventilation were not performed. Impression and clinical correlation: This EEG was obtained while sedated on precedex and is abnormal due to intermittent moderate to severe diffuse slowing indicative of global cerebral dysfunction indicative of global cerebral dysfunction, medication effect, or both. Epileptiform abnormalities were not seen during this recording. Bing Neighborsolleen Stack, MD [facility] [phone] If 7pm- 7am, please page neurology on call as listed in AMION.   ECHOCARDIOGRAM COMPLETE  Result Date: 11/04/2021    ECHOCARDIOGRAM REPORT   Patient Name:   Tillman SersROBERT Denbleyker Date of Exam: 11/04/2021 Medical Rec #:  [id number]       Height:       68.0 in Accession #:    [id number]      Weight:       243.4 lb Date of Birth:  08/12/1956        BSA:          2.222 m Patient Age:    65 years        BP:           118/58 mmHg Patient Gender: M               HR:  80 bpm. Exam Location:  Inpatient Procedure: 2D Echo, Cardiac Doppler, Color Doppler and Intracardiac            Opacification Agent Indications:    Stroke  History:        Patient has no prior history of Echocardiogram examinations.                 CAD, Prior CABG, Aortic Valve Disease; Risk Factors:Diabetes and                 Hypertension. S/P AAA repair-procedure date 02/10/15. S/P aortic                 valve replacement with 27 mm Medtronic Hancock II stented                 porcine valve-procedure date 02/10/15.                 Aortic Valve: bioprosthetic  valve is present in the aortic                 position.  Sonographer:    Ross Ludwig RDCS (AE) Referring Phys: [id number] [facility]  Sonographer Comments: Patient is morbidly obese and suboptimal subcostal window. Image acquisition challenging due to patient body habitus. IMPRESSIONS  1. Left ventricular ejection fraction, by estimation, is 70 to 75%. The left ventricle has hyperdynamic function. The left ventricle has no regional wall motion abnormalities. There is moderate concentric left ventricular hypertrophy. Left ventricular diastolic parameters are consistent with Grade I diastolic dysfunction (impaired relaxation).  2. Right ventricular systolic function is hyperdynamic. The right ventricular size is normal.  3. Left atrial size was moderately dilated.  4. The mitral valve is normal in structure. No evidence of mitral valve regurgitation.  5. The aortic valve has been repaired/replaced. Aortic valve regurgitation is not visualized. There is a bioprosthetic valve present in the aortic position. Aortic valve mean gradient measures 9.0 mmHg. Aortic valve Vmax measures 2.05 m/s. Comparison(s): Prior images unable to be directly viewed, comparison made by report only. FINDINGS  Left Ventricle: Left ventricular ejection fraction, by estimation, is 70 to 75%. The left ventricle has hyperdynamic function. The left ventricle has no regional wall motion abnormalities. Definity contrast agent was given IV to delineate the left ventricular endocardial borders. The left ventricular internal cavity size was normal in size. There is moderate concentric left ventricular hypertrophy. Abnormal (paradoxical) septal motion consistent with post-operative status. Left ventricular diastolic parameters are consistent with Grade I diastolic dysfunction (impaired relaxation). Indeterminate filling pressures. Right Ventricle: The right ventricular size is normal. No increase in right ventricular wall thickness. Right ventricular  systolic function is hyperdynamic. Left Atrium: Left atrial size was moderately dilated. Right Atrium: Right atrial size was normal in size. Pericardium: There is no evidence of pericardial effusion. Mitral Valve: The mitral valve is normal in structure. There is mild thickening of the mitral valve leaflet(s). There is mild calcification of the anterior mitral valve leaflet(s). No evidence of mitral valve regurgitation. MV peak gradient, 5.0 mmHg. The mean mitral valve gradient is 2.0 mmHg. Tricuspid Valve: The tricuspid valve is normal in structure. Tricuspid valve regurgitation is mild. Aortic Valve: The aortic valve has been repaired/replaced. Aortic valve regurgitation is not visualized. Aortic valve mean gradient measures 9.0 mmHg. Aortic valve peak gradient measures 16.8 mmHg. Aortic valve area, by VTI measures 4.73 cm. There is a bioprosthetic valve present in the aortic position. Pulmonic Valve: The pulmonic valve was not well  visualized. Pulmonic valve regurgitation is not visualized. Aorta: The aortic root and ascending aorta are structurally normal, with no evidence of dilitation. Venous: The inferior vena cava was not well visualized. IAS/Shunts: No atrial level shunt detected by color flow Doppler.  LEFT VENTRICLE PLAX 2D LVIDd:         4.10 cm   Diastology LVIDs:         2.80 cm   LV e' medial:    6.96 cm/s LV PW:         1.10 cm   LV E/e' medial:  14.3 LV IVS:        1.40 cm   LV e' lateral:   10.40 cm/s LVOT diam:     2.70 cm   LV E/e' lateral: 9.6 LV SV:         174 LV SV Index:   78 LVOT Area:     5.73 cm  RIGHT VENTRICLE RV Basal diam:  3.20 cm RV S prime:     10.00 cm/s TAPSE (M-mode): 2.5 cm LEFT ATRIUM             Index        RIGHT ATRIUM           Index LA diam:        4.70 cm 2.12 cm/m   RA Area:     20.60 cm LA Vol (A2C):   50.0 ml 22.51 ml/m  RA Volume:   63.00 ml  28.36 ml/m LA Vol (A4C):   49.0 ml 22.06 ml/m LA Biplane Vol: 49.2 ml 22.15 ml/m  AORTIC VALVE AV Area (Vmax):    4.30  cm AV Area (Vmean):   4.17 cm AV Area (VTI):     4.73 cm AV Vmax:           205.00 cm/s AV Vmean:          140.000 cm/s AV VTI:            0.368 m AV Peak Grad:      16.8 mmHg AV Mean Grad:      9.0 mmHg LVOT Vmax:         154.00 cm/s LVOT Vmean:        102.000 cm/s LVOT VTI:          0.304 m LVOT/AV VTI ratio: 0.83  AORTA Ao Root diam: 3.50 cm Ao Asc diam:  3.10 cm MITRAL VALVE MV Area (PHT): 2.23 cm     SHUNTS MV Area VTI:   6.43 cm     Systemic VTI:  0.30 m MV Peak grad:  5.0 mmHg     Systemic Diam: 2.70 cm MV Mean grad:  2.0 mmHg MV Vmax:       1.12 m/s MV Vmean:      71.5 cm/s MV Decel Time: 340 msec MV E velocity: 99.80 cm/s MV A velocity: 123.00 cm/s MV E/A ratio:  0.81 Mihai Croitoru MD Electronically signed by Thurmon Fair MD Signature Date/Time: 11/04/2021/3:18:01 PM    Final    CT HEAD CODE STROKE WO CONTRAST  Result Date: 11/03/2021 CLINICAL DATA:  Code stroke. Neuro deficit, acute, stroke suspected. EXAM: CT HEAD WITHOUT CONTRAST TECHNIQUE: Contiguous axial images were obtained from the base of the skull through the vertex without intravenous contrast. RADIATION DOSE REDUCTION: This exam was performed according to the departmental dose-optimization program which includes automated exposure control, adjustment of the mA and/or kV according to patient size and/or use of iterative reconstruction  technique. COMPARISON:  No pertinent prior exams available for comparison. FINDINGS: Brain: Mild generalized cerebral atrophy. Mild patchy and ill-defined hypoattenuation within the cerebral white matter, nonspecific but compatible chronic small vessel ischemic disease. Retrocerebellar CSF density prominence at midline, which may reflect a mega cisterna magna or posterior fossa arachnoid cyst. There is no acute intracranial hemorrhage. No demarcated cortical infarct. No extra-axial fluid collection. No evidence of an intracranial mass. No midline shift. Vascular: No hyperdense vessel.  Atherosclerotic  calcifications. Skull: Normal. Negative for fracture or focal lesion. Sinuses/Orbits: Visualized orbits show no acute finding. Mild mucosal thickening within the left frontal and right ethmoid sinuses. ASPECTS ([place] Stroke Program Early CT Score) - Ganglionic level infarction (caudate, lentiform nuclei, internal capsule, insula, M1-M3 cortex): 7 - Supraganglionic infarction (M4-M6 cortex): 3 Total score (0-10 with 10 being normal): 10 These results were communicated to Dr. Derry Lory At 10:28 amon 3/4/2023by text page via the [facility] messaging system. IMPRESSION: No evidence of acute intracranial abnormality. Mild chronic small vessel ischemic changes within the cerebral white matter. Mild generalized cerebral atrophy. Electronically Signed   By: Jackey Loge D.O.   On: 11/03/2021 10:28   CT ANGIO HEAD NECK W WO CM (CODE STROKE)  Result Date: 11/03/2021 CLINICAL DATA:  Provided history: Neuro deficit, acute, stroke suspected. EXAM: CT ANGIOGRAPHY HEAD AND NECK TECHNIQUE: Multidetector CT imaging of the head and neck was performed using the standard protocol during bolus administration of intravenous contrast. Multiplanar CT image reconstructions and MIPs were obtained to evaluate the vascular anatomy. Carotid stenosis measurements (when applicable) are obtained utilizing NASCET criteria, using the distal internal carotid diameter as the denominator. RADIATION DOSE REDUCTION: This exam was performed according to the departmental dose-optimization program which includes automated exposure control, adjustment of the mA and/or kV according to patient size and/or use of iterative reconstruction technique. CONTRAST:  Administered contrast not known at this time. COMPARISON:  Noncontrast head CT performed earlier the same day. FINDINGS: CTA NECK FINDINGS Aortic arch: Measured aortic branching. Atherosclerotic plaque within the visualized aortic arch and proximal major branch vessels of the neck. No hemodynamically  significant innominate or proximal subclavian artery stenosis. Right carotid system: CCA and ICA patent within the neck without significant stenosis (50% or greater). Mild atherosclerotic plaque about the carotid bifurcation and within the proximal ICA. Tortuosity of the cervical ICA. Left carotid system: CCA and ICA patent within the neck without significant stenosis (50% or greater). Mild atherosclerotic plaque about the carotid bifurcation and within the proximal ICA. Vertebral arteries: Vertebral arteries codominant and patent within the neck without significant stenosis. Minimal nonstenotic calcified plaque within the proximal left vertebral artery. Skeleton: No acute bony abnormality or aggressive osseous lesion. Cervical spondylosis. Cervical levocurvature with partially imaged thoracic dextrocurvature. Other neck: No neck mass or cervical lymphadenopathy. Upper chest: Prior median sternotomy. No consolidation within the imaged lung apices. Review of the MIP images confirms the above findings CTA HEAD FINDINGS Anterior circulation: The intracranial internal carotid arteries are patent. Calcified plaque within both vessels. No more than mild stenosis within the intracranial right ICA. Up to moderate stenosis within the cavernous left ICA. The M1 middle cerebral arteries are patent. No M2 proximal branch occlusion is identified. Atherosclerotic irregularity of the M2 and more distal MCA vessels, bilaterally. Most notably, there is a site of severe stenosis within a mid M2 right MCA vessel (series 12, image 8) and a site of apparent severe stenosis within a superior division proximal M2 left MCA vessel (series 12, image 23).  The anterior cerebral arteries are patent. No intracranial aneurysm is identified. Posterior circulation: The intracranial vertebral arteries are patent. Calcified plaque within the intracranial vertebral arteries, bilaterally with no more than mild stenosis. The basilar artery is patent.  The posterior cerebral arteries are patent. A small right posterior communicating artery is present. The left posterior communicating artery is diminutive or absent. Venous sinuses: Within the limitations of contrast timing, no convincing thrombus. Anatomic variants: As described. Review of the MIP images confirms the above findings No emergent large vessel occlusion identified. These results were communicated to Dr. Larinda Buttery 3/4/2023by text page via the [facility] messaging system. IMPRESSION: CTA neck: 1. The common carotid, internal carotid and vertebral arteries are patent within the neck without hemodynamically significant stenosis. Mild atherosclerotic plaque about the carotid bifurcations and within the proximal ICAs, and also within the proximal left vertebral artery. 2.  Aortic Atherosclerosis (ICD10-I70.0). CTA head: 1. No intracranial large vessel occlusion is identified. 2. Intracranial atherosclerotic disease with multifocal stenoses, most notably as follows. 3. Up to moderate stenosis within the cavernous left ICA. 4. Site of severe stenosis within a mid M2 right MCA vessel. 5. Site of severe stenosis within a proximal M2 left MCA vessel. Electronically Signed   By: Jackey Loge D.O.   On: 11/03/2021 10:47      HISTORY OF PRESENT ILLNESS Mr. Liron Eissler is a 66 y.o. male with history of hypertension, hyperlipidemia, diabetes, CAD status post CABG in 2016 admitted for right hand numbness, clumsiness, right facial droop and aphasia.  Initially symptoms largely resolved in ER, TNK was on hold.  However later patient aphasia getting worse, status post TNK. Follow up MRI showed no acute infarct.    HOSPITAL COURSE Suspected stroke status post TNK  TIA due to intracranial stenosis vs. Hypertensive encephalopathy CT no acute abnormality x 2 CTA head and neck intracranial stenosis with bilateral M2 severe stenosis MRI no acute infarct MRA head intracranial atherosclerosis with no hemodynamically  significant stenosis on intracranial MRA 2D Echo EF 70-75% LDL 182 HgbA1c 6.4 SCDs for VTE prophylaxis aspirin 81 mg daily prior to admission, now on aspirin 325 mg daily and clopidogrel 75 mg daily for 3 months and then Plavix alone given questionable bilateral M2 stenosis. Patient counseled to be compliant with his antithrombotic medications Ongoing aggressive stroke risk factor management Therapy recommendations: none Disposition: home today   Diabetes HgbA1c 6.4 goal < 7.0 Controlled CBG monitoring SSI DM education and close PCP follow up   Hypertensive urgency Hypertensive encephalopathy High on presentation, still fluctuate Episodes of HA, confusion, speech changes associated with high BP Resume home meds with norvasc 10, avapro 300 and Toprol 100 daily Long term BP goal normotensive   Orthostatic hypotension 3/5 - Per OT, BP taken after activity: sitting 108/54; standing 87/68 (pt unable to remain standing for pressure), after 1 min in sitting 125/65.  3/7- per RN record: lying- 167/77, sitting 170/96, Standing 0 min 159/81, Standing 3 min 127/82 Recurrent aphasia when transfer from chair back to bed, resolved after lying down.  Put on TED hose Close BP monitoring- recommend keeping a record and taking blood pressure three times per day Slow positioning changes, gradually increase activity PT/OT following   Hyperlipidemia Home meds: Lipitor 20 LDL 182, goal < 70 Now on Lipitor 80 Continue statin at discharge   Other Stroke Risk Factors Advanced age Obesity, Body mass index is 37.01 kg/m.  Coronary artery disease status post CABG in 2016   Other Active Problems Episodes  of agitation, combative, delirium - concerning related to Benadryl, close monitoring   DISCHARGE EXAM Blood pressure 138/70, pulse 80, temperature 97.8 F (36.6 C), temperature source Oral, resp. rate 14, height 5\' 8"  (1.727 m), weight 110.4 kg, SpO2 97 %. Mental Status -  Level of arousal  and orientation to time, place, and person were intact. Language including expression, naming, repetition, comprehension was assessed and found intact. Fund of Knowledge was assessed and was intact.   Cranial Nerves II - XII - II - Visual field intact OU. III, IV, VI - Extraocular movements intact. V - Facial sensation intact bilaterally. VII - Facial movement intact bilaterally. VIII - Hearing & vestibular intact bilaterally. X - Palate elevates symmetrically. XI - Chin turning & shoulder shrug intact bilaterally. XII - Tongue protrusion intact.   Motor Strength - The patients strength was normal in all extremities and pronator drift was absent.  Bulk was normal and fasciculations were absent.   Motor Tone - Muscle tone was assessed at the neck and appendages and was normal.   Reflexes - The patients reflexes were symmetrical in all extremities and he had no pathological reflexes.   Sensory - Light touch, temperature/pinprick were assessed and were symmetrical.     Coordination - The patient had normal movements in the hands and feet with no ataxia or dysmetria.  Tremor was absent.   Gait and Station - deferred.  DISCHARGE PLAN Disposition:  Home aspirin 325 mg daily and clopidogrel 75 mg daily for secondary stroke prevention for 3 months then plavix alone. Blood pressure record, recommend 3 times per day Follow up with OP nutrition/dietary services- referral sent Ongoing stroke risk factor control by Primary Care Physician at time of discharge Follow-up PCP, [place] in 2 weeks. Follow-up in [facility] in 4 weeks, office to schedule an appointment.   35 minutes were spent preparing discharge.  Patient seen and examined by NP/APP with MD. MD to update note as needed.   Elmer Picker, DNP, FNP-BC [facility] Pager: [phone]  ATTENDING NOTE: I reviewed above note and agree with the assessment and plan. Pt was seen and  examined.   Wife at the bedside.  Patient sitting in chair, neuro intact.  Patient eager to be discharged.  Discussed about home BP management, BP monitoring, physician follow-up, lifestyle changes.  Continue DAPT for 3 months and then Plavix alone.  Continue statin and home BP meds.  Follow-up with [facility] in 4 weeks.  For detailed assessment and plan, please refer to above as I have made changes wherever appropriate.   Marvel Plan, MD PhD Stroke Neurology 11/06/2021 2:48 PM

## 2021-12-19 ENCOUNTER — Encounter: Payer: Self-pay | Admitting: Dietician

## 2021-12-19 ENCOUNTER — Encounter: Payer: BC Managed Care – PPO | Attending: Neurology | Admitting: Dietician

## 2021-12-19 VITALS — Ht 68.0 in | Wt 230.6 lb

## 2021-12-19 DIAGNOSIS — E1169 Type 2 diabetes mellitus with other specified complication: Secondary | ICD-10-CM | POA: Diagnosis not present

## 2021-12-19 DIAGNOSIS — Z952 Presence of prosthetic heart valve: Secondary | ICD-10-CM

## 2021-12-19 DIAGNOSIS — Z951 Presence of aortocoronary bypass graft: Secondary | ICD-10-CM

## 2021-12-19 DIAGNOSIS — I10 Essential (primary) hypertension: Secondary | ICD-10-CM | POA: Diagnosis not present

## 2021-12-19 DIAGNOSIS — Z794 Long term (current) use of insulin: Secondary | ICD-10-CM

## 2021-12-19 DIAGNOSIS — I639 Cerebral infarction, unspecified: Secondary | ICD-10-CM | POA: Diagnosis not present

## 2021-12-19 NOTE — Progress Notes (Signed)
Medical Nutrition Therapy: Visit start time: 1030  end time: 1130  ?Assessment:  Diagnosis: history of CVA 11/03/21 ?Past medical history: essential HTN, type 2 diabetes, hx of aortic valve replacement, CABG ?Psychosocial issues/ stress concerns: none ? ?Preferred learning method:  ?Auditory ?Visual ? ? ?Current weight: 230.6lbs Height: 5'8" BMI: 35.06 ? ?Medications, supplements: reconciled list in medical record ? ?Progress and evaluation:  ?Patient reports working on diet and lifestyle changes for several years, since heart surgery, with support from his wife. ?He limits sodium intake; states sodium levels have been on the low side. ?Labs measured during hospital stay 11/04/21: HbA1C 6.4%, cholesterol 241, HDL 50, LDL 182, Triglycerides 46.  ?He reports HTN is currently well controlled. ?  ? ?Physical activity: golf 3x weekly, gradually increasing walking when on the course. ? ?Dietary Intake:  ?Usual eating pattern includes 3 meals and 1-2 snacks per day. ?Dining out frequency: 1 meals per week. ? ?Breakfast: 1 pc toast or 1/2 whole grain bagel with smart balance margarine, yogurt ?Snack: none ?Lunch: salad with chicken or tuna; leftovers ?Snack: fruit or almonds ?Supper: small portion meat, not fried + beans/ veg  ?Snack: whole wheat crackers or fruit ?Beverages: water, unsweetened tea, coffee, rarely soda ? ?Intervention:  ? ?Nutrition Care Education: ?  ?Basic nutrition: basic food groups; appropriate nutrient balance; appropriate meal and snack schedule; general nutrition guidelines; instructed on Mediterranean eating pattern for heart health, diabetes, and weight control. ?Weight control: importance of low sugar and low fat choices, portion control, estimated energy needs for weight loss at 1600-1700 kcal, provided guidance for 45% CHO, 25% pro, 30% fat; discussed low carb vs low fat eating and advised mediterranean pattern ?Advanced nutrition:  food label reading for carbohydrates, fats ?Diabetes:  appropriate  meal and snack schedule, appropriate carb intake and balance, healthy carb choices, role of fiber, protein, fat ?Hypertension: identifying high sodium foods, identifying food sources of potassium, magnesium ?Hyperlipidemia:  healthy and unhealthy fats, role of fiber, plant sterols, role of exercise ? ?Other: ?Patient and wife feel he is doing well with his dietary habits and his goal is to continue his current eating pattern at this time.  ?He will schedule MNT follow up as needed, no additional visit scheduled at this time. ? ?Nutritional Diagnosis:  [place]-2.2 Altered nutrition-related laboratory As related to hyperlipidemia, hypertension, type 2 diabetes.  As evidenced by elevated total cholesterol and LDL, history of elevated BP and stroke, and HbA1C controlled with medications. ?[place]-3.3 Overweight/obesity As related to history of excess calories.  As evidenced by patient with current BMI of 35, making diet and lifestyle changes to lose weight and reduce health risk. ? ? ?Education Materials given:  ?General diet guidelines for Heart health ?Plate Planner with food lists, sample meal pattern ?Sample menus ?Diabetes-friendly Menus and Recipes ?Unable to print visit summary due to connectivity issues ? ? ?Learner/ who was taught:  ?Patient  ?Spouse/ partner ? ? ?Level of understanding: ?Verbalizes/ demonstrates competency ? ? ?Demonstrated degree of understanding via:   Teach back ?Learning barriers: ?None ? ? ?Willingness to learn/ readiness for change: ?Eager, change in progress ? ? ?Monitoring and Evaluation:  Dietary intake, exercise, BP control, blood lipids, diabetes control, and body weight ?     follow up: prn  ?

## 2021-12-19 NOTE — Patient Instructions (Signed)
Continue with eating pattern that includes generous amounts of vegetables and fruits, whole grains in controlled portions, lean protein foods mostly poultry, fish, and plant based proteins (beans, nuts, seeds) ?Continue with regular exercise as tolerated and cleared by physician. ?

## 2021-12-24 ENCOUNTER — Ambulatory Visit (INDEPENDENT_AMBULATORY_CARE_PROVIDER_SITE_OTHER): Payer: BC Managed Care – PPO | Admitting: Adult Health

## 2021-12-24 ENCOUNTER — Encounter: Payer: Self-pay | Admitting: Adult Health

## 2021-12-24 VITALS — BP 151/78 | HR 79 | Ht 68.0 in | Wt 234.0 lb

## 2021-12-24 DIAGNOSIS — Z9189 Other specified personal risk factors, not elsewhere classified: Secondary | ICD-10-CM

## 2021-12-24 DIAGNOSIS — Z09 Encounter for follow-up examination after completed treatment for conditions other than malignant neoplasm: Secondary | ICD-10-CM

## 2021-12-24 DIAGNOSIS — I639 Cerebral infarction, unspecified: Secondary | ICD-10-CM

## 2021-12-24 DIAGNOSIS — I669 Occlusion and stenosis of unspecified cerebral artery: Secondary | ICD-10-CM

## 2021-12-24 NOTE — Progress Notes (Signed)
?[facility] ?[address] ?[place]. [place] [address] ?[phone] ? ?     HOSPITAL FOLLOW UP NOTE ? ?[name] ?Date of Birth:  [birth year] ?Medical Record Number:  [id number]  ? ?Reason for Referral:  hospital stroke follow up ? ? ? ?SUBJECTIVE: ? ? ?CHIEF COMPLAINT:  ?Chief Complaint  ?Patient presents with  ? Follow-up  ?  RM 3 alone ?Pt is well, states he is fine. No concerns   ? ? ?HPI:  ? ?[name] is a 66 y.o. male with history of hypertension, hyperlipidemia, diabetes, CAD status post CABG in 2016 who presented on 11/03/2021 with right hand numbness, clumsiness, right facial droop and aphasia.  Initially symptoms largely resolved in ER and TNK placed on hold.  However, later patient aphasia worsened therefore received TNK.  MRI negative for acute infarct.  CTA head/neck showed intracranial stenosis with bilateral M2 severe stenosis.  MRA head showed intracranial arthrosclerosis with no hemodynamically significant stenosis.  EF 70 to 75%.  LDL 182.  A1c 6.4.  Evaluated by Dr. Erlinda Hong for suspected stroke s/p TNK with possible TIA in setting of intercranial stenosis vs hypertensive encephalopathy.  Noted elevated BP on presentation, fluctuated throughout stay, episodes of HA, confusion, and speech changes associated with high BP.  Resumed home BP meds.  Also noted orthostatic hypotension.  Recommended DAPT for 3 months then Plavix alone given questionable bilateral M2 stenosis as well as increased home dose atorvastatin from 20 mg to 80 mg daily.  No prior stroke history.  Therapy evaluations without therapy needs. ? ? ?Today, 12/24/2021, Mr. Steig is being seen for initial hospital follow-up unaccompanied.  He has been doing very well since discharge without any new or reoccurring stroke/TIA symptoms.  He has returned back to all prior activities without difficulty.  Reports headaches present prior to hospitalization but has not had any additional headaches since that time.   Currently taking aspirin 81 mg daily and atorvastatin 80 mg daily, denies side effects.  It was recommended 80-month DAPT duration but was only provided 30 days worth of aspirin 325 and plavix and was unable to get refills, denies any bleeding or bruising concerns but was having GI issues as he was unable to take his omeprazole while on Plavix.  Blood pressure today 151/78.  Monitors at home and typically 120-140s/60-80s. He has not yet been seen by PCP but has follow-up visit scheduled tomorrow.  Does report being off majority of his medications (including blood pressure and statin) prior to admission due to insurance and pharmacy change and difficulty obtaining his prescription medications.  Reports complete compliance since discharge.  He also notes prior history of A-fib postop in 2016 after CABG procedure, he was on amiodarone for short duration and has not had any issues since.  Closely follows with cardiologist with repeat EKGs in normal sinus rhythm.  Denies previously having completed a cardiac monitor.  No further concerns at this time. ? ? ? ? ? ?PERTINENT IMAGING ? ?Per hospitalization 11/03/2021 ?CT no acute abnormality x 2 ?CTA head and neck intracranial stenosis with bilateral M2 severe stenosis ?MRI no acute infarct ?MRA head intracranial atherosclerosis with no hemodynamically significant stenosis on intracranial MRA ?2D Echo EF 70-75% ?LDL 182 ?HgbA1c 6.4 ? ? ?ROS:   ?14 system review of systems performed and negative with exception of those listed in HPI ? ?PMH:  ?Past Medical History:  ?Diagnosis Date  ? Coronary artery disease   ? Diabetes mellitus without complication ([place])   ?  Hyperlipidemia   ? Hypertension   ? ? ?PSH:  ?Past Surgical History:  ?Procedure Laterality Date  ? AORTIC VALVE REPLACEMENT  02/10/2015  ? CORONARY ARTERY BYPASS GRAFT    ? ? ?Social History:  ?Social History  ? ?Socioeconomic History  ? Marital status: Married  ?  Spouse name: Not on file  ? Number of children: Not on file   ? Years of education: Not on file  ? Highest education level: Not on file  ?Occupational History  ? Not on file  ?Tobacco Use  ? Smoking status: Never  ? Smokeless tobacco: Not on file  ?Substance and Sexual Activity  ? Alcohol use: Yes  ?  Comment: occasional 1 drink  ? Drug use: Not on file  ? Sexual activity: Not on file  ?Other Topics Concern  ? Not on file  ?Social History Narrative  ? Not on file  ? ?Social Determinants of Health  ? ?Financial Resource Strain: Not on file  ?Food Insecurity: Not on file  ?Transportation Needs: Not on file  ?Physical Activity: Not on file  ?Stress: Not on file  ?Social Connections: Not on file  ?Intimate Partner Violence: Not on file  ? ? ?Family History: History reviewed. No pertinent family history. ? ?Medications:   ?Current Outpatient Medications on File Prior to Visit  ?Medication Sig Dispense Refill  ? amLODipine (NORVASC) 10 MG tablet Take 10 mg by mouth daily.    ? aspirin EC 325 MG EC tablet Take 1 tablet (325 mg total) by mouth daily. 30 tablet 0  ? atorvastatin (LIPITOR) 80 MG tablet Take 1 tablet (80 mg total) by mouth daily. 30 tablet 1  ? BREO ELLIPTA 100-25 MCG/ACT AEPB Inhale 1 puff into the lungs daily as needed (sob).    ? Cholecalciferol 50 MCG (2000 UT) CAPS Take by mouth.    ? Coenzyme Q10 300 MG CAPS Take by mouth.    ? doxazosin (CARDURA) 1 MG tablet Take 1 mg by mouth daily.    ? Insulin Pen Needle (NOVOFINE PEN NEEDLE) 32G X 6 MM MISC USE TO INJECT EMDICATION    ? metFORMIN (GLUCOPHAGE) 1000 MG tablet Take 1,000 mg by mouth 2 (two) times daily with a meal.    ? metoprolol succinate (TOPROL-XL) 100 MG 24 hr tablet Take 100 mg by mouth daily.    ? Multiple Vitamin (MULTI-VITAMINS) TABS Take 1 tablet by mouth every evening.    ? omeprazole (PRILOSEC) 20 MG capsule Take by mouth.    ? OZEMPIC, 0.25 OR 0.5 MG/DOSE, 2 MG/1.5ML SOPN Inject 0.25 mg into the skin once a week.    ? pioglitazone (ACTOS) 15 MG tablet Take 15 mg by mouth daily.    ? TRESIBA  FLEXTOUCH 200 UNIT/ML FlexTouch Pen Inject 70 Units into the skin every evening.    ? ?No current facility-administered medications on file prior to visit.  ? ? ?Allergies:   ?Allergies  ?Allergen Reactions  ? Benadryl [Diphenhydramine] Other (See Comments)  ?  Paradoxical agitation, combative, delirium  ? Codeine Nausea And Vomiting  ? ? ? ? ?OBJECTIVE: ? ?Physical Exam ? ?Vitals:  ? 12/24/21 0906  ?BP: (!) 151/78  ?Pulse: 79  ?Weight: 234 lb (106.1 kg)  ?Height: 5\' 8"  (1.727 m)  ? ?Body mass index is 35.58 kg/m?Marland Kitchen ?No results found. ? ?Post stroke PHQ 2/9 ? ?  12/19/2021  ?  4:59 PM  ?Depression screen PHQ 2/9  ?Decreased Interest 0  ?Down, Depressed, Hopeless 0  ?  PHQ - 2 Score 0  ?  ? ?General: well developed, well nourished, very pleasant elderly Caucasian male, seated, in no evident distress ?Head: head normocephalic and atraumatic.   ?Neck: supple with no carotid or supraclavicular bruits ?Cardiovascular: regular rate and rhythm, no murmurs ?Musculoskeletal: no deformity ?Skin:  no rash/petichiae ?Vascular:  Normal pulses all extremities ?  ?Neurologic Exam ?Mental Status: Awake and fully alert. Fluent speech and language. Oriented to place and time. Recent and remote memory intact. Attention span, concentration and fund of knowledge appropriate. Mood and affect appropriate.  ?Cranial Nerves: Fundoscopic exam reveals sharp disc margins. Pupils equal, briskly reactive to light. Extraocular movements full without nystagmus. Visual fields full to confrontation. Hearing intact. Facial sensation intact. Face, tongue, palate moves normally and symmetrically.  ?Motor: Normal bulk and tone. Normal strength in all tested extremity muscles ?Sensory.: intact to touch , pinprick , position and vibratory sensation.  ?Coordination: Rapid alternating movements normal in all extremities. Finger-to-nose and heel-to-shin performed accurately bilaterally. ?Gait and Station: Arises from chair without difficulty. Stance is normal.  Gait demonstrates normal stride length and balance without use of AD. Tandem walk and heel toe without difficulty.  ?Reflexes: 1+ and symmetric. Toes downgoing.  ? ? ? ?NIHSS  0 ?Modified Rankin  0 ? ? ? ? ?

## 2021-12-24 NOTE — Patient Instructions (Signed)
Continue aspirin 81mg  and atorvastatin for secondary stroke prevention  ? ?We will keep you updated regarding further work up/imaging if needed or any change to your medication regimen  ? ?Continue to follow up with PCP regarding cholesterol, blood pressure and diabetes management  ?Maintain strict control of hypertension with blood pressure goal below 130/90, diabetes with hemoglobin A1c goal below 7.0 % and cholesterol with LDL cholesterol (bad cholesterol) goal below 70 mg/dL.  ? ?Signs of a Stroke? Follow the BEFAST method:  ?Balance Watch for a sudden loss of balance, trouble with coordination or vertigo ?Eyes Is there a sudden loss of vision in one or both eyes? Or double vision?  ?Face: Ask the person to smile. Does one side of the face droop or is it numb?  ?Arms: Ask the person to raise both arms. Does one arm drift downward? Is there weakness or numbness of a leg? ?Speech: Ask the person to repeat a simple phrase. Does the speech sound slurred/strange? Is the person confused ? ?Time: If you observe any of these signs, call 911. ? ? ? ? ? ? ? ?Thank you for coming to see at [facility]. I hope we have been able to provide you high quality care today. ? ?You may receive a patient satisfaction survey over the next few weeks. We would appreciate your feedback and comments so that we may continue to improve ourselves and the health of our patients. ? ?

## 2021-12-28 ENCOUNTER — Encounter: Payer: Self-pay | Admitting: Adult Health

## 2021-12-28 ENCOUNTER — Other Ambulatory Visit: Payer: Self-pay | Admitting: Adult Health

## 2021-12-28 DIAGNOSIS — I4891 Unspecified atrial fibrillation: Secondary | ICD-10-CM

## 2021-12-28 DIAGNOSIS — Z9189 Other specified personal risk factors, not elsewhere classified: Secondary | ICD-10-CM

## 2021-12-28 DIAGNOSIS — I639 Cerebral infarction, unspecified: Secondary | ICD-10-CM

## 2021-12-28 MED ORDER — PANTOPRAZOLE SODIUM 40 MG PO TBEC
40.0000 mg | DELAYED_RELEASE_TABLET | Freq: Every day | ORAL | 0 refills | Status: AC
Start: 2021-12-28 — End: ?

## 2021-12-28 MED ORDER — CLOPIDOGREL BISULFATE 75 MG PO TABS: 75.0000 mg | ORAL_TABLET | Freq: Every day | ORAL | 0 refills | Status: AC

## 2021-12-31 ENCOUNTER — Telehealth: Payer: Self-pay | Admitting: *Deleted

## 2021-12-31 NOTE — Telephone Encounter (Signed)
Called patient but call was disconnected x 2. Called back and LVM requesting a call back.  ?

## 2021-12-31 NOTE — Telephone Encounter (Signed)
Patient returned call and I advised him of NP's message: the plan after discussing further with Dr. Roda Shutters - will plan on repeating CTA head to assess for resolution of the narrowing of arteries. He will be called to set up a 30 day heart monitor to evaluate for possible atrial fibrillation.  Recommend restarting Plavix for 30 days in addition to aspirin ( to complete 3 mo duration) then just aspirin alone. Will need to hold omeprazole while on plavix - sent in 30 day rx of pantoprazole. These were discussed as possibilities during recent admission so this is just to let him know the plan now that I further discussed with Dr. Roda Shutters.  Please let me know if he has any questions or concerns.  ?Patient verbalized understanding, appreciation. ? ?

## 2022-01-01 ENCOUNTER — Telehealth: Payer: Self-pay | Admitting: Adult Health

## 2022-01-01 NOTE — Telephone Encounter (Signed)
Lorella Nimrod Josem Kaufmann [id number] (exp. 01/01/22-03/01/22) sent to GI they will reach out to schedule ?

## 2022-01-23 ENCOUNTER — Other Ambulatory Visit: Payer: Self-pay | Admitting: Adult Health

## 2022-02-11 ENCOUNTER — Encounter: Payer: Self-pay | Admitting: *Deleted

## 2022-02-11 NOTE — Telephone Encounter (Signed)
Called patient, LVM requesting call back. Sent my chart.

## 2022-02-11 NOTE — Telephone Encounter (Signed)
Can we follow up with patient to see if he started cardiac monitor yet? Also, does not look like CTA has yet to be scheduled - can we look in to this as well? Thank you!

## 2022-02-12 ENCOUNTER — Other Ambulatory Visit: Payer: Self-pay

## 2022-02-12 NOTE — Patient Outreach (Signed)
[facility]) Care Management  02/12/2022  Franklin Calderon June 05, 1956 [id number]   First telephone outreach attempt to obtain mRS. Call dropped will try again later.  [name] [facility] Management Assistant [phone]

## 2022-02-14 ENCOUNTER — Other Ambulatory Visit: Payer: Self-pay

## 2022-02-14 NOTE — Telephone Encounter (Signed)
Okay. Thank you.

## 2022-02-14 NOTE — Telephone Encounter (Signed)
Called patient, LVM #2 requesting call back to advise [place] if he has started cardiac monitor and I advised he still needs to schedule CTA.

## 2022-02-14 NOTE — Patient Outreach (Signed)
[facility]) Care Management  02/14/2022  Franklin Calderon [birth year] [id number]   Second telephone outreach attempt to obtain mRS. No answer. Left message for returned call.  [name] [facility] Management Assistant [phone]

## 2022-02-18 ENCOUNTER — Other Ambulatory Visit: Payer: Self-pay

## 2022-02-18 NOTE — Patient Outreach (Signed)
[facility]) Care Management  02/18/2022  Franklin Calderon 10/30/1955 [id number]   3 outreach attempts were completed to obtain mRs. mRs could not be obtained because patient never returned my calls. mRs=7    [name] Care Management Assistant [phone]

## 2022-08-22 ENCOUNTER — Encounter: Payer: Self-pay | Admitting: *Deleted

## 2022-08-22 NOTE — Progress Notes (Signed)
Patient was enrolled for [employer] to ship a 30 day cardiac event monitor to his home on 12/02/2001.  Patient did not apply cardiac event monitor and [employer] cancelled his enrollment. Order will be cancelled.

## 2024-02-25 ENCOUNTER — Encounter: Payer: Self-pay | Admitting: Podiatry

## 2024-02-25 ENCOUNTER — Ambulatory Visit (INDEPENDENT_AMBULATORY_CARE_PROVIDER_SITE_OTHER)

## 2024-02-25 ENCOUNTER — Ambulatory Visit (INDEPENDENT_AMBULATORY_CARE_PROVIDER_SITE_OTHER): Admitting: Podiatry

## 2024-02-25 DIAGNOSIS — M7752 Other enthesopathy of left foot: Secondary | ICD-10-CM

## 2024-02-25 DIAGNOSIS — M14672 Charcot's joint, left ankle and foot: Secondary | ICD-10-CM

## 2024-02-25 DIAGNOSIS — E1142 Type 2 diabetes mellitus with diabetic polyneuropathy: Secondary | ICD-10-CM

## 2024-02-25 DIAGNOSIS — M7751 Other enthesopathy of right foot: Secondary | ICD-10-CM

## 2024-02-25 DIAGNOSIS — M7750 Other enthesopathy of unspecified foot: Secondary | ICD-10-CM

## 2024-02-25 DIAGNOSIS — M14671 Charcot's joint, right ankle and foot: Secondary | ICD-10-CM | POA: Diagnosis not present

## 2024-02-25 MED ORDER — GABAPENTIN 300 MG PO CAPS: ORAL_CAPSULE | ORAL | 0 refills | Status: AC

## 2024-02-25 NOTE — Patient Instructions (Signed)
  VISIT SUMMARY: Today, we discussed your ongoing foot pain and swelling, which is related to your diabetic neuropathy and a condition called Charcot arthropathy. We reviewed your symptoms, history, and previous treatments, and developed a plan to manage your conditions and prevent further complications.  YOUR PLAN: -CHARCOT ARTHROPATHY: Charcot arthropathy is a condition where the bones in the foot weaken and collapse, often due to nerve damage from diabetes. To manage this, you will be referred to Dr. Mabel Breeding at [facility] for specialized care. You will also receive a walking boot for your right foot and a lace-up ankle brace for your left foot to provide stability and support. It is important to avoid going barefoot and to wear supportive shoes. Limit your physical activities to low-impact exercises like swimming, using an exercise bike, or an elliptical machine. Avoid high-impact activities such as running, jogging, jumping, and playing pickleball. We will not use injections for pain management at this time.  -DIABETIC POLYNEUROPATHY: Diabetic polyneuropathy is nerve damage caused by diabetes, leading to pain, burning, and sharp sensations in your feet and ankles. To help manage this pain, we will start you on gabapentin, beginning with a low dose at night and gradually increasing to include morning and daytime doses as you tolerate it. Be aware of potential side effects like drowsiness, dizziness, and sleepiness. Additionally, consider discussing continuous glucose monitoring with your primary care provider to better manage your blood sugar levels.  INSTRUCTIONS: Please follow up with Dr. Mabel Breeding at [facility] for specialized evaluation and management of your Charcot arthropathy. Begin taking gabapentin as prescribed for your neuropathic pain, and monitor for any side effects. Discuss the possibility of continuous glucose monitoring with your primary care provider to help manage your  diabetes more effectively.

## 2024-02-25 NOTE — Progress Notes (Signed)
 Subjective:  Patient ID: Franklin Calderon, male    DOB: 06-28-1956,  MRN: [id number]  Chief Complaint  Patient presents with   Foot Pain    My heels hurt and the middle of my feet.  The left one is worse.  I've been to [facility] before.  I saw another Podiatrist before and he told me there's nothing that could be done.    Discussed the use of AI scribe software for clinical note transcription with the patient, who gave verbal consent to proceed.  History of Present Illness Franklin Calderon is a 68 year old male with diabetic neuropathy who presents with bilateral foot pain and swelling.  He experiences bilateral foot pain and swelling, described as a dull sensation akin to 'walking on pebbles.' The pain is more pronounced in the left foot and worsens with prolonged standing and walking, improving when off his feet. It is not sharp but intensifies throughout the day.  He has a history of neuropathy, occasionally causing sharp pains, particularly on the top of his feet and around the ankles, with burning sensations that can last for a day. He is not currently on any medication for neuropathy.  His diabetes is managed with an A1c of 6.7 for the past two to three years. Foot issues began over the winter and worsened in April after playing pickleball, where he experienced a jamming sensation in his right foot, taking several months to recover. He attributes the onset of his foot pain to this activity.  He went to the ER for x-rays after this  He previously consulted a podiatrist who identified bone spurs and suggested the possibility of ganglion cysts causing swelling. However, he was informed that there was little that could be done at that time.  He does not currently take any medications for his foot pain or neuropathy. He has tried using arch supports but found them painful and has since discontinued their use.        Objective:    Physical Exam VASCULAR: Palpable strong bounding DP and  PT pulses. Foot is warm and well-perfused. Capillary fill time is brisk. DERMATOLOGIC: Normal skin turgor, texture, and temperature. No open lesions, rashes, or ulcerations. NEUROLOGIC: Diffuse polyneuropathy with absent protective and light touch sensation. ORTHOPEDIC: Significant edema around bilateral midfoot and ankle, more severe on the right side. No pain to palpation of plantar fascia or plantar heel.  Plantigrade foot   No images are attached to the encounter.    Results LABS A1c: 6.7%  RADIOLOGY Bilateral foot x-ray: Severe midfoot collapse and arthritic changes with Charcot arthropathy of the Chopart and midtarsal joints, severe fragmentation of the right foot, maintained plantar grade appearance 02/25/2024 worsening since April ankle x-rays   Assessment:   1. Charcot joint of left foot   2. Charcot joint of right foot   3. Diabetic polyneuropathy associated with type 2 diabetes mellitus (HCC)      Plan:  Patient was evaluated and treated and all questions answered.  Assessment and Plan Assessment & Plan Charcot arthropathy Charcot arthropathy with severe midfoot collapse and arthritic changes, particularly affecting the Chopart and midtarsal joints. Severe fragmentation in the right foot with overall maintained plantar grade appearance. Condition exacerbated by previous traumatic event during pickleball. High risk of progression to foot collapse, ulceration, and potential amputation if not managed appropriately. Actively progressing, requiring urgent intervention to prevent further deterioration. Approximately 50% of individuals with this condition may result in amputation due to foot collapse and ulceration. -  Refer to Dr. Mabel Breeding at [facility] for specialized evaluation and management. - Provide a walking boot for the right foot to stabilize and prevent further collapse.  Difficulty managing her bilateral cases such as this - Provide a lace-up ankle brace for the  left foot for additional support. - Advise against going barefoot and recommend wearing supportive shoes. - Limit physical activity to low-impact exercises such as swimming, exercise bike, and elliptical. Avoid running, jogging, jumping, and pickleball. - Avoid injections for pain management at this time.  Diabetic polyneuropathy Diabetic polyneuropathy with painful symptoms, including burning and sharp pains, particularly affecting the top of the foot and ankle. Neuropathy is contributing to the development and progression of Charcot arthropathy. Current A1c is 6.7, indicating controlled diabetes, but neuropathy remains symptomatic. Gabapentin is recommended for symptomatic relief, with a gradual increase in dosage to manage pain without causing significant drowsiness or dizziness. - Initiate gabapentin for neuropathic pain management, starting with a low dose at night and gradually increasing to include morning and daytime doses as tolerated. - Discuss potential side effects of gabapentin, including drowsiness, dizziness, and sleepiness. - Encourage discussion with primary care provider regarding continuous glucose monitoring to better manage blood sugar levels.      Return if symptoms worsen or fail to improve.

## 2024-02-26 ENCOUNTER — Telehealth: Payer: Self-pay

## 2024-02-26 NOTE — Telephone Encounter (Signed)
 Referral, office note and demographics faxed to Dr. Smith at [facility] podiatry [phone], fax [phone]

## 2024-02-26 NOTE — Telephone Encounter (Signed)
-----   Message from Juliene JONELLE Medicine sent at 02/25/2024  9:55 PM EDT ----- Urgent Charcot referral for Dr. Smith at [facility] if you do not mind sending.  Thank you!

## 2024-05-22 ENCOUNTER — Other Ambulatory Visit: Payer: Self-pay | Admitting: Podiatry

## 2024-07-16 ENCOUNTER — Emergency Department

## 2024-07-16 ENCOUNTER — Other Ambulatory Visit: Payer: Self-pay

## 2024-07-16 ENCOUNTER — Emergency Department
Admission: EM | Admit: 2024-07-16 | Discharge: 2024-07-16 | Disposition: A | Discharge status: Home / Self Care | Source: Ambulatory Visit | Attending: Emergency Medicine | Admitting: Emergency Medicine

## 2024-07-16 DIAGNOSIS — I10 Essential (primary) hypertension: Secondary | ICD-10-CM | POA: Insufficient documentation

## 2024-07-16 DIAGNOSIS — M25511 Pain in right shoulder: Secondary | ICD-10-CM | POA: Diagnosis not present

## 2024-07-16 DIAGNOSIS — S199XXA Unspecified injury of neck, initial encounter: Secondary | ICD-10-CM | POA: Diagnosis present

## 2024-07-16 DIAGNOSIS — E119 Type 2 diabetes mellitus without complications: Secondary | ICD-10-CM | POA: Diagnosis not present

## 2024-07-16 DIAGNOSIS — S161XXA Strain of muscle, fascia and tendon at neck level, initial encounter: Secondary | ICD-10-CM | POA: Diagnosis not present

## 2024-07-16 DIAGNOSIS — R519 Headache, unspecified: Secondary | ICD-10-CM | POA: Insufficient documentation

## 2024-07-16 DIAGNOSIS — Y92410 Unspecified street and highway as the place of occurrence of the external cause: Secondary | ICD-10-CM | POA: Insufficient documentation

## 2024-07-16 DIAGNOSIS — I2510 Atherosclerotic heart disease of native coronary artery without angina pectoris: Secondary | ICD-10-CM | POA: Insufficient documentation

## 2024-07-16 MED ORDER — NAPROXEN 500 MG PO TBEC: 500.0000 mg | DELAYED_RELEASE_TABLET | Freq: Two times a day (BID) | ORAL | 0 refills | Status: AC

## 2024-07-16 MED ORDER — CYCLOBENZAPRINE HCL 10 MG PO TABS
10.0000 mg | ORAL_TABLET | Freq: Once | ORAL | Status: AC
  Administered 2024-07-16: 10 mg via ORAL
  Filled 2024-07-16: qty 1

## 2024-07-16 MED ORDER — CYCLOBENZAPRINE HCL 10 MG PO TABS: 10.0000 mg | ORAL_TABLET | Freq: Three times a day (TID) | ORAL | 0 refills | Status: AC | PRN
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
## 2024-07-16 NOTE — ED Notes (Signed)
 Pt is in x-ray at this time, x-ray will bring pt to [facility] once finished with imaging.
